# Patient Record
Sex: Male | Born: 1935 | Race: White | Hispanic: No | Smoking: Former smoker
Health system: Southern US, Community
[De-identification: ages and names within clinical notes are randomized; demographics above are authoritative.]

## PROBLEM LIST (undated history)

## (undated) DIAGNOSIS — F329 Major depressive disorder, single episode, unspecified: Secondary | ICD-10-CM

## (undated) DIAGNOSIS — J449 Chronic obstructive pulmonary disease, unspecified: Secondary | ICD-10-CM

## (undated) DIAGNOSIS — I499 Cardiac arrhythmia, unspecified: Secondary | ICD-10-CM

## (undated) DIAGNOSIS — R42 Dizziness and giddiness: Secondary | ICD-10-CM

## (undated) DIAGNOSIS — A389 Scarlet fever, uncomplicated: Secondary | ICD-10-CM

## (undated) DIAGNOSIS — M1990 Unspecified osteoarthritis, unspecified site: Secondary | ICD-10-CM

## (undated) DIAGNOSIS — R011 Cardiac murmur, unspecified: Secondary | ICD-10-CM

## (undated) DIAGNOSIS — IMO0001 Reserved for inherently not codable concepts without codable children: Secondary | ICD-10-CM

## (undated) DIAGNOSIS — I10 Essential (primary) hypertension: Secondary | ICD-10-CM

## (undated) DIAGNOSIS — I509 Heart failure, unspecified: Secondary | ICD-10-CM

## (undated) DIAGNOSIS — I4891 Unspecified atrial fibrillation: Secondary | ICD-10-CM

## (undated) DIAGNOSIS — I2510 Atherosclerotic heart disease of native coronary artery without angina pectoris: Secondary | ICD-10-CM

## (undated) DIAGNOSIS — K219 Gastro-esophageal reflux disease without esophagitis: Secondary | ICD-10-CM

## (undated) DIAGNOSIS — E785 Hyperlipidemia, unspecified: Secondary | ICD-10-CM

## (undated) DIAGNOSIS — R5383 Other fatigue: Secondary | ICD-10-CM

## (undated) DIAGNOSIS — F32A Depression, unspecified: Secondary | ICD-10-CM

## (undated) HISTORY — DX: Major depressive disorder, single episode, unspecified: F32.9

## (undated) HISTORY — DX: Essential (primary) hypertension: I10

## (undated) HISTORY — DX: Heart failure, unspecified: I50.9

## (undated) HISTORY — DX: Scarlet fever, uncomplicated: A38.9

## (undated) HISTORY — DX: Unspecified atrial fibrillation: I48.91

## (undated) HISTORY — DX: Atherosclerotic heart disease of native coronary artery without angina pectoris: I25.10

## (undated) HISTORY — DX: Other fatigue: R53.83

## (undated) HISTORY — DX: Gastro-esophageal reflux disease without esophagitis: K21.9

## (undated) HISTORY — DX: Cardiac murmur, unspecified: R01.1

## (undated) HISTORY — DX: Dizziness and giddiness: R42

## (undated) HISTORY — DX: Reserved for inherently not codable concepts without codable children: IMO0001

## (undated) HISTORY — DX: Hyperlipidemia, unspecified: E78.5

## (undated) HISTORY — DX: Chronic obstructive pulmonary disease, unspecified: J44.9

## (undated) HISTORY — DX: Depression, unspecified: F32.A

## (undated) HISTORY — DX: Cardiac arrhythmia, unspecified: I49.9

## (undated) HISTORY — DX: Unspecified osteoarthritis, unspecified site: M19.90

---

## 2004-12-17 HISTORY — PX: CORONARY ARTERY BYPASS GRAFT: SHX141

## 2004-12-17 HISTORY — PX: AORTIC VALVE REPLACEMENT: SHX41

## 2005-02-23 ENCOUNTER — Encounter: Payer: Self-pay | Admitting: Cardiovascular Disease

## 2005-02-23 ENCOUNTER — Inpatient Hospital Stay (HOSPITAL_COMMUNITY): Admission: EM | Admit: 2005-02-23 | Discharge: 2005-03-09 | Payer: Self-pay | Admitting: Emergency Medicine

## 2005-02-24 ENCOUNTER — Encounter (INDEPENDENT_AMBULATORY_CARE_PROVIDER_SITE_OTHER): Payer: Self-pay | Admitting: *Deleted

## 2009-02-16 ENCOUNTER — Ambulatory Visit (HOSPITAL_COMMUNITY): Admission: RE | Admit: 2009-02-16 | Discharge: 2009-02-16 | Payer: Self-pay | Admitting: Cardiovascular Disease

## 2009-08-19 ENCOUNTER — Ambulatory Visit: Payer: Self-pay | Admitting: Cardiovascular Disease

## 2009-08-19 ENCOUNTER — Telehealth: Payer: Self-pay | Admitting: Cardiovascular Disease

## 2009-08-26 ENCOUNTER — Telehealth: Payer: Self-pay | Admitting: Cardiovascular Disease

## 2009-08-30 ENCOUNTER — Ambulatory Visit: Payer: Self-pay | Admitting: Cardiovascular Disease

## 2009-09-09 ENCOUNTER — Ambulatory Visit: Payer: Self-pay | Admitting: Cardiovascular Disease

## 2009-09-19 ENCOUNTER — Telehealth: Payer: Self-pay | Admitting: Cardiovascular Disease

## 2009-10-03 ENCOUNTER — Ambulatory Visit: Payer: Self-pay | Admitting: Cardiovascular Disease

## 2009-12-02 ENCOUNTER — Ambulatory Visit: Payer: Self-pay | Admitting: Cardiovascular Disease

## 2009-12-02 ENCOUNTER — Telehealth: Payer: Self-pay | Admitting: Cardiovascular Disease

## 2009-12-05 ENCOUNTER — Telehealth: Payer: Self-pay | Admitting: Cardiovascular Disease

## 2010-01-02 ENCOUNTER — Telehealth (INDEPENDENT_AMBULATORY_CARE_PROVIDER_SITE_OTHER): Payer: Self-pay | Admitting: Physician Assistant

## 2010-01-02 ENCOUNTER — Emergency Department (HOSPITAL_COMMUNITY): Admission: EM | Admit: 2010-01-02 | Discharge: 2010-01-02 | Payer: Self-pay | Admitting: Emergency Medicine

## 2010-02-24 ENCOUNTER — Ambulatory Visit: Payer: Self-pay | Admitting: Cardiovascular Disease

## 2010-05-22 ENCOUNTER — Telehealth: Payer: Self-pay | Admitting: Cardiovascular Disease

## 2010-06-23 ENCOUNTER — Ambulatory Visit: Payer: Self-pay | Admitting: Cardiovascular Disease

## 2010-07-26 ENCOUNTER — Telehealth: Payer: Self-pay | Admitting: Cardiovascular Disease

## 2010-08-30 ENCOUNTER — Telehealth: Payer: Self-pay | Admitting: Cardiovascular Disease

## 2010-09-08 ENCOUNTER — Telehealth (INDEPENDENT_AMBULATORY_CARE_PROVIDER_SITE_OTHER): Payer: Self-pay | Admitting: *Deleted

## 2010-09-08 ENCOUNTER — Encounter: Payer: Self-pay | Admitting: Cardiovascular Disease

## 2010-09-08 ENCOUNTER — Ambulatory Visit: Payer: Self-pay | Admitting: Cardiovascular Disease

## 2010-09-11 ENCOUNTER — Ambulatory Visit: Payer: Self-pay

## 2010-09-11 ENCOUNTER — Ambulatory Visit: Payer: Self-pay | Admitting: Internal Medicine

## 2010-09-11 ENCOUNTER — Encounter: Payer: Self-pay | Admitting: Cardiovascular Disease

## 2010-09-11 ENCOUNTER — Ambulatory Visit (HOSPITAL_COMMUNITY): Admission: RE | Admit: 2010-09-11 | Discharge: 2010-09-11 | Payer: Self-pay | Admitting: Cardiovascular Disease

## 2010-09-13 ENCOUNTER — Ambulatory Visit: Payer: Self-pay | Admitting: Cardiovascular Disease

## 2010-09-13 ENCOUNTER — Ambulatory Visit (HOSPITAL_COMMUNITY): Admission: RE | Admit: 2010-09-13 | Discharge: 2010-09-13 | Payer: Self-pay | Admitting: Cardiovascular Disease

## 2010-09-13 HISTORY — PX: CARDIAC CATHETERIZATION: SHX172

## 2010-09-22 ENCOUNTER — Ambulatory Visit: Payer: Self-pay | Admitting: Cardiovascular Disease

## 2010-11-20 ENCOUNTER — Telehealth: Payer: Self-pay | Admitting: Cardiovascular Disease

## 2011-01-07 ENCOUNTER — Encounter: Payer: Self-pay | Admitting: Cardiothoracic Surgery

## 2011-01-18 NOTE — Progress Notes (Signed)
  Phone Note From Other Clinic   Call For: Dr Raynelle Bring Reason for Call: Schedule Patient Appt Summary of Call: Mr Hollibaugh went to [facility] for cp. ER MD felt symptoms m-s in nature but w/ hx CAD, felt f/u appropriate. Pt scheduled to see Dr Freida Busman 1-18 @ 10:30. Initial call taken by: Park Breed PA-C,  January 02, 2010 12:45 PM

## 2011-01-18 NOTE — Progress Notes (Signed)
----   Converted from flag ---- ---- 09/08/2010 2:48 PM, Marilynne Halsted, CMA, AAMA wrote: NO PRECERT FOR THE ECHO.  I AM WORKING ON HRT CATH PRECERT.  ---- 04/54/0981 2:32 PM, Dessie Coma  LPN wrote: Echo at [facility]. for CHF and aortic valve replacement on Mon. 09/11/10 at 1pm/ LT and RT HRT Cath on Wed. 09/13/10 at 8:30am for CHF with Dr. Kirke Corin performing procedure.  Thanks, Victorino Dike ------------------------------

## 2011-01-18 NOTE — Progress Notes (Signed)
  Phone Note Outgoing Call   Call placed by: Dessie Coma  LPN,  July 26, 2010 9:14 AM Call placed to: Patient Summary of Call: Patient notified per Dr. Kirke Corin, increase Livalo to 4mg  at bedtime for elevated cholesterol.  Rx will be called in to [facility].

## 2011-01-18 NOTE — Progress Notes (Signed)
Summary: joint aches  Phone Note Call from Patient   Caller: Daughter Summary of Call: T.C. from Daughter Sheila-patient c/o joint aches again and wants to know if can cut Crestor 10mg  tabs in half to see if that helps.  Will alert Dr. Kirke Corin and call daughter back at [phone]. Initial call taken by: Dessie Coma  LPN,  November 20, 2010 9:27 AM  Follow-up for Phone Call        Phone Call Completed, Kamala Kolton Notified:LMVM for patient to try cutting Crestor in half to see if will help his joint aches.  Follow-up by: Dessie Coma  LPN,  November 20, 2010 3:22 PM

## 2011-01-18 NOTE — Progress Notes (Signed)
  Phone Note Call from Patient   Caller: Daughter Summary of Call: T.C. from Dtr. Sheila-father c/o muscle aches on Lipitor 40mg .  Wants to go back on 20mg .  Tolerated 20mg  OK.  Sheila's ph# [phone].

## 2011-01-18 NOTE — Progress Notes (Signed)
  Phone Note From Pharmacy   Caller: [facility] [place] Reason for Call: Patient requests substitution Action Taken: Nurse/provider will call patient Summary of Call: T.C. from [facility] in [place] re: Rx for Livalo 4mg .  Rx is $85 with insurance.  Patient states will not be able to afford medication and requests something to try that is cheaper.  Will notify MD and call patient.  Initial call taken by: Dessie Coma  LPN,  July 26, 2010 3:34 PM  Follow-up for Phone Call        Patient came by for samples of Livalo 4mg  to last until MD decides what alternative to give him. Follow-up by: Dessie Coma  LPN,  July 27, 2010 8:02 AM

## 2011-01-18 NOTE — Progress Notes (Signed)
  Phone Note Outgoing Call   Call placed by: Dessie Coma  LPN,  August 30, 2010 9:09 AM Call placed to: Patient Summary of Call: Patient notified per Dr. Kirke Corin, lab work was OK that was drawn at [facility].  Has f/u in Jan.

## 2011-02-16 ENCOUNTER — Ambulatory Visit: Payer: Self-pay | Admitting: Cardiovascular Disease

## 2011-02-22 ENCOUNTER — Ambulatory Visit: Payer: Self-pay | Admitting: Cardiovascular Disease

## 2011-03-01 LAB — PROTIME-INR
INR: 1.02 (ref 0.00–1.49)
Prothrombin Time: 13.6 seconds (ref 11.6–15.2)

## 2011-03-01 LAB — POCT I-STAT 3, VENOUS BLOOD GAS (G3P V)
Acid-base deficit: 1 mmol/L (ref 0.0–2.0)
Bicarbonate: 28.1 mEq/L — ABNORMAL HIGH (ref 20.0–24.0)
TCO2: 27 mmol/L (ref 0–100)
TCO2: 30 mmol/L (ref 0–100)
pCO2, Ven: 47.6 mmHg (ref 45.0–50.0)
pCO2, Ven: 47.9 mmHg (ref 45.0–50.0)
pO2, Ven: 33 mmHg (ref 30.0–45.0)

## 2011-03-01 LAB — POCT I-STAT 3, ART BLOOD GAS (G3+)
Acid-Base Excess: 1 mmol/L (ref 0.0–2.0)
O2 Saturation: 96 %
TCO2: 27 mmol/L (ref 0–100)
pCO2 arterial: 39.8 mmHg (ref 35.0–45.0)
pO2, Arterial: 77 mmHg — ABNORMAL LOW (ref 80.0–100.0)

## 2011-03-04 LAB — PROTIME-INR
INR: 1.66 — ABNORMAL HIGH (ref 0.00–1.49)
Prothrombin Time: 19.5 seconds — ABNORMAL HIGH (ref 11.6–15.2)

## 2011-03-04 LAB — POCT I-STAT, CHEM 8
BUN: 16 mg/dL (ref 6–23)
Calcium, Ion: 0.96 mmol/L — ABNORMAL LOW (ref 1.12–1.32)
Chloride: 105 meq/L (ref 96–112)
Creatinine, Ser: 1.3 mg/dL (ref 0.4–1.5)
Glucose, Bld: 95 mg/dL (ref 70–99)
HCT: 44 % (ref 39.0–52.0)
Hemoglobin: 15 g/dL (ref 13.0–17.0)
Potassium: 5.2 meq/L — ABNORMAL HIGH (ref 3.5–5.1)
Sodium: 135 meq/L (ref 135–145)
TCO2: 25 mmol/L (ref 0–100)

## 2011-03-04 LAB — TROPONIN I: Troponin I: 0.06 ng/mL (ref 0.00–0.06)

## 2011-03-04 LAB — CBC
HCT: 41.9 % (ref 39.0–52.0)
MCHC: 33.7 g/dL (ref 30.0–36.0)
RBC: 4.6 MIL/uL (ref 4.22–5.81)
WBC: 12.4 10*3/uL — ABNORMAL HIGH (ref 4.0–10.5)

## 2011-03-04 LAB — DIFFERENTIAL
Basophils Absolute: 0 10*3/uL (ref 0.0–0.1)
Lymphocytes Relative: 16 % (ref 12–46)
Monocytes Absolute: 1.3 10*3/uL — ABNORMAL HIGH (ref 0.1–1.0)
Monocytes Relative: 10 % (ref 3–12)
Neutro Abs: 8.8 10*3/uL — ABNORMAL HIGH (ref 1.7–7.7)

## 2011-03-04 LAB — POCT CARDIAC MARKERS
CKMB, poc: 1.1 ng/mL (ref 1.0–8.0)
Myoglobin, poc: 134 ng/mL (ref 12–200)
Troponin i, poc: <0.05 ng/mL (ref 0.00–0.09)

## 2011-03-04 LAB — APTT: aPTT: 35 seconds (ref 24–37)

## 2011-03-08 ENCOUNTER — Encounter: Payer: Self-pay | Admitting: Cardiovascular Disease

## 2011-03-08 ENCOUNTER — Ambulatory Visit (INDEPENDENT_AMBULATORY_CARE_PROVIDER_SITE_OTHER): Payer: Medicare Other | Admitting: Cardiovascular Disease

## 2011-03-08 ENCOUNTER — Ambulatory Visit: Payer: Self-pay | Admitting: Cardiovascular Disease

## 2011-03-08 DIAGNOSIS — I509 Heart failure, unspecified: Secondary | ICD-10-CM

## 2011-03-08 DIAGNOSIS — I499 Cardiac arrhythmia, unspecified: Secondary | ICD-10-CM

## 2011-03-08 DIAGNOSIS — E785 Hyperlipidemia, unspecified: Secondary | ICD-10-CM

## 2011-03-08 DIAGNOSIS — I4891 Unspecified atrial fibrillation: Secondary | ICD-10-CM | POA: Insufficient documentation

## 2011-03-08 DIAGNOSIS — I2510 Atherosclerotic heart disease of native coronary artery without angina pectoris: Secondary | ICD-10-CM | POA: Insufficient documentation

## 2011-03-08 NOTE — Assessment & Plan Note (Signed)
No clear symptoms of angina at this time. Most recent cardiac cath showed patent grafts. Will continue medical therapy.

## 2011-03-08 NOTE — Assessment & Plan Note (Signed)
His rate is well controlled with Metoprolol.  Continue long term anticoagulation with Warfarin. Goal INR 2-3. This is managed by his PCP.

## 2011-03-08 NOTE — Assessment & Plan Note (Signed)
Unfortunately, he seems to be intolerant to all statins due to myalgia. I think it is reasonable to try Niacin at this time.

## 2011-03-08 NOTE — Progress Notes (Signed)
HPI  [name] [name] is a 76 y/o male with known history of CAD and congestive heart failure as well as chronic atrial fibrillation. He was seen last week by Dr. Maisie Fus for increased dyspnea, cough and weight gain. He was congestive. His BNP was 524 which was double his baseline. His dose of Lasix was increased from 40 mg bid to 40 mg/80 mg. He also used Mucinix. He gradually improved. There was no chest pain. He is back on his usual dose of Lasix. He feels close to baseline.   No Known Allergies   No current outpatient prescriptions on file prior to visit.     Past Medical History  Diagnosis Date  . Hypertension   . COPD (chronic obstructive pulmonary disease)     chronic  . Depression   . Fatigue   . Arthritis   . Hernia NEC     hiatel  . Reflux   . Heart murmur   . Scarlet fever   . Dizziness   . CHF (congestive heart failure)     most recent EF 50-55%  . Coronary artery disease   . Arrhythmia     chronic atrial fibrillation  . Hyperlipidemia      Past Surgical History  Procedure Date  . Coronary artery bypass graft 2006    [place]  . Aortic valve replacement 2006    33-mm pericardial Edward's valve  . Cardiac catheterization 09/13/2010    patent grafts. no significant pulmonary hypertension.      Family History  Problem Relation Age of Onset  . Cancer Mother   . Heart attack Father   . Heart attack Brother   . Cancer Brother      History   Social History  . Marital Status: Single    Spouse Name: N/A    Number of Children: N/A  . Years of Education: N/A   Occupational History  . Not on file.   Social History Main Topics  . Smoking status: Former Smoker    Quit date: 12/17/1980  . Smokeless tobacco: Not on file  . Alcohol Use: No  . Drug Use: No  . Sexually Active:    Other Topics Concern  . Not on file   Social History Narrative  . No narrative on file     ROS Constitutional: Negative for fever, chills, diaphoresis, activity change,  appetite change and fatigue.  HENT: Negative for hearing loss, nosebleeds, congestion, sore throat, facial swelling, drooling, trouble swallowing, neck pain, voice change, sinus pressure and tinnitus.  Eyes: Negative for photophobia, pain, discharge and visual disturbance.  Respiratory: Negative for apnea, cough, chest tightness, shortness of breath and wheezing.  Cardiovascular: Negative for chest pain, palpitations and leg swelling.  Gastrointestinal: Negative for nausea, vomiting, abdominal pain, diarrhea, constipation, blood in stool and abdominal distention.  Genitourinary: Negative for dysuria, urgency, frequency, hematuria and decreased urine volume.  Musculoskeletal: Negative for myalgias, back pain, joint swelling, arthralgias and gait problem.  Skin: Negative for color change, pallor, rash and wound.  Neurological: Negative for dizziness, tremors, seizures, syncope, speech difficulty, weakness, light-headedness, numbness and headaches.  Psychiatric/Behavioral: Negative for suicidal ideas, hallucinations, behavioral problems and agitation. The patient is not nervous/anxious.    PHYSICAL EXAM   BP 111/68  Pulse 51  Wt 221 lb 12.8 oz (100.608 kg)  SpO2 97%  Constitutional: He is oriented to person, place, and time. He appears well-developed and well-nourished. No distress.  HENT:  Head: Normocephalic and atraumatic.  Eyes: Pupils are  equal, round, and reactive to light. Right eye exhibits no discharge. Left eye exhibits no discharge.  Neck: Normal range of motion. Neck supple. No JVD present. No thyromegaly present.  Cardiovascular:  irregular rhythm, normal heart sounds and intact distal pulses. Exam reveals no gallop and no friction rub.  No murmur heard.  Pulmonary/Chest: Effort normal and breath sounds normal. No stridor. No respiratory distress. He has no wheezes. He has no rales. He exhibits no tenderness.  Abdominal: Soft. Bowel sounds are normal. He exhibits no distension.  There is no tenderness. There is no rebound and no guarding.  Musculoskeletal: Normal range of motion. He exhibits no edema and no tenderness.  Neurological: He is alert and oriented to person, place, and time. Coordination normal.  Skin: Skin is warm and dry. No rash noted. He is not diaphoretic. No erythema. No pallor.  Psychiatric: He has a normal mood and affect. His behavior is normal. Judgment and thought content normal.      ASSESSMENT AND PLAN

## 2011-03-08 NOTE — Assessment & Plan Note (Signed)
Acute on chronic. Seems to be improving after he was treated by Dr. Maisie Fus. He is back on regular dose Lasix and feels close to his baseline. He has borderline ejection fraction. He did have cardiac catheterization in 09/11 and no intervention was needed. Will continue medical therapy. Avoid over diuresis due to mild renal failure. Current dose of Lasix 40 mg bid seems reasonable.

## 2011-03-30 ENCOUNTER — Ambulatory Visit: Payer: Self-pay | Admitting: Cardiovascular Disease

## 2011-05-01 NOTE — Assessment & Plan Note (Signed)
[name] [name]                        [place] OFFICE NOTE   NAME:[name] [name], [name] [name]                     MRN:          [id number]  DATE:06/23/2010                            DOB:          26-Aug-1935    PROBLEM LIST:  1. Coronary artery disease status post coronary artery bypass graft in      2006 at [name] with left internal mammary artery to      left anterior descending, saphenous vein graft to right coronary      artery, saphenous vein graft to obtuse marginal 1 and obtuse      marginal 3.  2. Status post aortic valve replacement with a 33-mm pericardial      Edwards valve, model number 2700, serial number [id number].  3. Congestive heart failure with ischemic cardiomyopathy and mildly      reduced left ventricular systolic function.  Ejection fraction is      40%.  4. Hyperlipidemia.  5. Hypertension.  6. Chronic atrial fibrillation, on long-term anticoagulation.  7. Chronic obstructive pulmonary disease.   INTERVAL HISTORY:  [name] [name] is here for followup visit.  He has been  reasonably stable since the last time he was seen.  He has chronic  dyspnea with his regular activities, but is still able to work around  his yard.  He gets out of breath after about 10-15 minutes with his  regular activities.  He does not really get significant episodes of  chest pain.  He has major issues at this time that include myalgias,  back pain and lower extremity pain which he attributes to Lipitor.  He  has been very hesitant on taking statins, he has been taking 20 mg of  Lipitor though.  He denies any orthopnea or PND.  No lower extremity  edema.  He has been taking rest of his medications.   SOCIAL HISTORY:  He quit smoking 29 years ago.   CURRENT MEDICATIONS:  1. Warfarin as directed.  2. Metoprolol 50 mg twice daily.  3. Aspirin 81 mg daily.  4. Fish oil 40981 mg twice daily.  5. Klor-Con 10 mEq once daily.  6. Furosemide 40 mg  once daily.  7. Lipitor 20 mg at bedtime.  8. Losartan 50 mg once daily.   PHYSICAL EXAMINATION:  GENERAL:  He appears to be in no acute distress.  VITAL SIGNS:  Weight is 229.8 pounds, blood pressure 141/71, pulse is  54, oxygen saturation is 99% on room air.  NECK:  No JVD, carotid bruits or scars.  LUNGS:  Few crackles at the left lung base.  CARDIOVASCULAR:  Irregularly irregular with no appreciated gallops or  murmurs.  ABDOMEN:  Benign, nontender, nondistended.  EXTREMITIES:  No clubbing, cyanosis or edema.  Femoral pulses are +2  bilaterally.  Posterior tibial pulses +2 bilaterally as well.   LABORATORY AND DIAGNOSTIC DATA:  His labs were as reviewed.  His BNP was  281 which was slightly higher than before.  His lipid profile shows now  an LDL of 79, triglyceride of 61 and an HDL of 43.  This is  significantly improved since his last lipid profile.  His creatinine was  1.4.   IMPRESSION:  1. Congestive heart failure with mildly reduced left ventricular      systolic function due to ischemic cardiomyopathy.  He continues to      be in class II and sometimes class III heart failure.  His main      symptoms are dyspnea  Although his BNP was slightly elevated, he      seems to be well compensated at this time.  There is no significant      fluid overload.  We will continue with current dose of furosemide.      We will continue with his beta-blocker and ARB.  2. Coronary artery disease:  No evidence of angina at this time.  We      will continue with his current medications.  3. Hyperlipidemia:  Again, he is complaining of myalgia with Lipitor.      He has been taking 20 mg daily.  He informed me that he might stop      taking the medication altogether.  I explained to him the      importance of statins and prevention of progression of coronary      artery disease.  I will switch him to Livalo 2 mg once daily to      replace Lipitor.  We will have followup lipid and liver  profile as      well as CPK in 4 weeks from now.  He is also concerned about the      cost, but we will try to help him with this.  4. Chronic atrial fibrillation.  Continue long-term anticoagulation      with warfarin.  The patient will follow up in 6 months from now or      earlier if needed.     Lorine Bears, MD  Electronically Signed    MA/MedQ  DD: 06/23/2010  DT: 06/24/2010  Job #: [id number]

## 2011-05-01 NOTE — Assessment & Plan Note (Signed)
[facility]                        [place] CARDIOLOGY OFFICE NOTE   NAME:[name] [name], [name] [name]                     MRN:          [id number]  DATE:09/09/2009                            DOB:          07-20-1935    PROBLEM LIST:  1. Coronary artery disease, status post coronary artery bypass graft      in 2006 with a left internal mammary artery to left anterior      descending, vein graft to right coronary artery, sequential vein      graft to OM1 and OM3.  He is also status post aortic valve      replacement with a 23-mm pericardial Edwards valve, model number      2700, serial number [id number].  2. Ischemic cardiomyopathy with an ejection fraction of 40% on an echo      dated August 26, 2009.  There is only trace MR and mild TR at      that time.  3. Hyperlipidemia.  Fasting lipid profile from August 19, 2009,      shows a total cholesterol of 242, HDL 45, triglycerides 115, LDL      174, that was on no medical treatment for hyperlipidemia.  4. Hypertension.  5. Atrial fibrillation, on Coumadin therapy.  6. Chronic obstructive pulmonary disease.   INTERVAL HISTORY:  The patient states since his last visit at the  beginning of September, he has actually felt better.  His episodes of  dyspnea on exertion have significantly improved.  He states that he  continues to work at [employer] which involves a good deal of walking.  He is able to perform his daily activities and only very occasionally  gets short of breath to the point where he must rest.  He denies any  episodes of chest discomfort.  He sleeps on 1 pillow.  Denies any PND or  lower extremity edema.  He states compliance with all of his medications  and he has been tolerating his new simvastatin well.  He denies any  dizziness or syncope.   REVIEW OF SYSTEMS:  Otherwise, negative.   MEDICATION LIST:  1. Aspirin 81 mg daily.  2. Coumadin 5 mg 5 days a week and 2.5 mg 2 days a week.  3.  Metoprolol tartrate 50 mg b.i.d.  4. Lasix 40 mg daily.  5. Fish oil 1200 mg b.i.d.  6. Klor-Con 1 daily.  7. Simvastatin 40 mg nightly.   SOCIAL HISTORY:  History of tobacco and alcohol, but has not used either  in 48 years.   FAMILY HISTORY:  Positive for coronary artery disease and multiple  malignancies.   PHYSICAL EXAMINATION:  VITAL SIGNS:  He has a blood pressure of 127/70,  heart rate is 68, weight is 223 pounds, which is 2-3 pounds more than he  weighed a month ago.  He is satting 97% on room air.  GENERAL:  No acute distress.  HEENT:  Nonfocal.  NECK:  Supple.  There is no JVD.  There are no carotid bruits.  HEART:  Irregularly irregular.  Without murmur, rub, or gallop.  LUNGS:  Clear  bilaterally.  ABDOMEN:  Soft, nontender, nondistended.  EXTREMITIES:  Without edema.  SKIN:  Warm and dry.   Review of the patient's echocardiogram as above in his problem list.  The patient also wore a Holter monitor that showed an average heart rate  of 75, maximum of 96 and minimum of 57, there were occasional  bradycardic episodes in the early morning.  There are also infrequent  premature ventricular contractions.   ASSESSMENT AND PLAN:  The patient appears to be doing well from a  cardiovascular standpoint.  His dyspnea on exertion has improved and he  is tolerating simvastatin well.  He has an ischemic cardiomyopathy with  NYHA class 2 symptoms.  We recommend that he continue his medications as  listed above.  In addition, we would like to start an ACE inhibitor or  an ARB today.  Follow up with a BMP in 1-week time.  We will see the  patient back in clinic in 2-3 months at which time we will recheck his  fasting lipids and an INR.  Regarding the patient's occasional  bradycardic episodes in the evening, he is told that if he was to  experience any dizziness whatsoever or increase in his shortness of  breath or fatigue, we will consider decreasing his metoprolol dose to 25  mg  p.o. b.i.d.  He will contact our office in case this happens before  his next visit.     Brayton El, MD  Electronically Signed    SGA/MedQ  DD: 09/09/2009  DT: 09/09/2009  Job #: [id number]

## 2011-05-01 NOTE — Assessment & Plan Note (Signed)
[facility] CARDIOLOGY OFFICE NOTE   NAME:[name] [name], [name] [name]                     MRN:          [id number]  DATE:02/24/2010                            DOB:          1935/07/26    PROBLEM LIST:  1. Coronary artery disease status post coronary artery bypass graft in      2006 with a left internal mammary artery to left anterior      descending , saphenous vein graft to right carotid artery,      saphenous vein graft to obtuse marginal 1 and obtuse marginal 3.  2. Status post aortic valve replacement with a 33-mm pericardial      Edwards valve, model number 2700, serial number [id number].  3. Ischemic cardiomyopathy with an ejection fraction 40% on echo from      August 26, 2009.  4. Hyperlipidemia.  5. Hypertension.  6. Chronic atrial fibrillation, on Coumadin with last Holter in      September 2010.  7. Chronic obstructive pulmonary disease.   INTERVAL HISTORY:  The patient has had his last office visit he had bout  of pneumonia, was on antibiotics at home.  Of note, he states he was  afebrile during this episode.  Since then he has been feeling better.  However, he continues to have dyspnea on exertion.  He states that it is  intermittent and sometimes occurs with little exertion and sometimes  requires a significant exertion to recur.  He denies any lower extremity  edema, chest discomfort, PND, or orthopnea.  He has been taken off on  ramipril and he is no longer coughing, although it is unclear whether  the cough was from a respiratory illness or from side effect of the ACE  inhibitor.  He has been compliant with his Lipitor and has not had any  muscle aches as he did with simvastatin in the past.   REVIEW OF SYSTEMS:  Positive for frequent waking at night and his family  states that he snores quite a bit.   PHYSICAL EXAMINATION:  VITAL SIGNS:  Today, his blood pressure is  121/72, his pulse 62, his weight is  223, which is 8 pounds more than he  weighed in December.  He is sating 97% on room air.  The patient was  walked in the office and his saturations as stated 97%.  GENERAL:  He is in no acute distress.  HEENT:  Normocephalic and atraumatic.  NECK:  Supple.  There is very minimal JVD.  HEART:  Irregularly irregular without murmur, rub, or gallop.  LUNGS:  Some mild crackles at the right base.  ABDOMEN:  Soft and nontender.  EXTREMITIES:  Without edema.  SKIN:  Warm and dry, pulse 2+ bilateral radial and posterior tibial  pulses.  PSYCHIATRIC:  The patient is appropriate.   Review of the patient's lipid profile dated December 2010, his total  cholesterol 229, LDL 161, HDL 43, and triglycerides 409.   ASSESSMENT AND PLAN:  1. Coronary artery disease.  The patient is not having any symptoms      consistent with angina.  He should continue on aspirin, beta-      blocker, statin, fish oil, and we will add ARB today.  2. Hyperlipidemia.  The patient's last LDL was 160.  He is currently      taking Lipitor 20 mg daily and tolerating this.  We will increase      to 40 mg today and samples are given.  3. Ischemic cardiomyopathy, ejection fraction was 40% on his last      echocardiogram and he is on metoprolol tartrate 50 mg twice a day      and we will start losartan 50 mg daily today as he has been      possibly intolerant to ACE INHIBITORS secondary to cough in the      past.  He is perhaps slightly volume overloaded today and we will      ask him to increase his Lasix to 40 mg twice a day for the next 4-5      days.  Once the patient may goes back to his 40 mg day dose, if his      shortness of breath was significantly better on the higher dose,      he will contact the office and it may be that he needs a higher      baseline dose of the Lasix.  4. Sleep disturbances.  The patient possibly has obstructive sleep      apnea.  We have offered him a sleep test today that he currently       declines.  5. Atrial fibrillation.  The patient should continue on Coumadin.  Her      appears to be well rate controlled and was so per his left heart      monitor.  6. Valve replacement, last echo showed the valve to be functioning      adequately.  7. Chronic obstructive pulmonary disease is managed by his primary      care physician.  We will see the patient back in 3 months' time      with a CMP, fasting lipid, and BNP.     Brayton El, MD  Electronically Signed    SGA/MedQ  DD: 02/24/2010  DT: 02/25/2010  Job #: [id number]   cc:   M. Katharina Caper, MD

## 2011-05-01 NOTE — Assessment & Plan Note (Signed)
[facility]                        [place] CARDIOLOGY OFFICE NOTE   NAME:[name] [name], [name] [name]                     MRN:          [id number]  DATE:12/02/2009                            DOB:          [birth year]    PROBLEM LIST:  1. Coronary artery disease status post coronary artery bypass graft in      2006 with a left internal mammary artery to the left anterior      descending, saphenous vein graft to the right coronary artery,      saphenous vein graft to an obtuse marginal 1 and obtuse marginal 3.  2. Status post aortic valve replacement with a 33-mm pericardial      Edwards valve model number 2700, serial number [id number].  3. Ischemic cardiomyopathy with an ejection fraction of 40% on echo      from August 26, 2009.  4. Hyperlipidemia.  5. Hypertension.  6. Chronic atrial fibrillation, on Coumadin with his last Holter in      September 2010.  7. Chronic obstructive pulmonary disease.   INTERVAL HISTORY:  The patient states since last office visit, he had  seen his primary care physician who thought he had a combination of  bronchitis and/or a CHF exacerbation.  She increased his Lasix for a  week and placed him on antibiotics.  The patient states he feels better  since that time.  The patient was also taken off his simvastatin, as he  had bilateral lower extremity muscle aches.  His muscle aches have  resolved after stopping his simvastatin.  The patient also endorses a  cough today that he thinks probably started since he began taking the  ramipril.  He states the cough has significantly improved now.  However,  it is still present.  The patient does endorse occasional episode of  chest discomfort, but this has not changed in quite sometime and the  episodes are self-limiting.  The patient does state he has had increased  shortness of breath and dyspnea on exertion over the past month or two  that is slightly above his baseline level and he has  been attributing  this to the possible bronchitis.   PHYSICAL EXAMINATION:  VITAL SIGNS:  Today, blood pressure in the right  arm is 82/52, left arm is 87/55; pulse is 65; sating 97% on room air.  He weighs 216 pounds in general, which is 7 pounds less than he weighed  in October.  GENERAL:  No acute distress.  HEENT:  Normocephalic, atraumatic.  NECK:  Supple.  There are no carotid bruits.  HEART:  Irregularly irregular without murmur, rub or gallop.  LUNGS:  Clear bilaterally.  ABDOMEN:  Soft, nontender, and nondistented.  EXTREMITIES:  Without edema.  SKIN:  Cool and dry.  MUSCULOSKELETAL:  5/5 in bilateral upper and lower extremity strength.  PSYCHIATRIC:  The patient is appropriate.   Review of the patient's cholesterol profile from September showed an LDL  of 174 and that was without any medical treatment.   ASSESSMENT AND PLAN:  1. Coronary artery disease.  The patient is not having any progression  of the infrequent angina that he experiences.  He should continue      on aspirin, beta-blocker, and fish oil.  We will try a different      statin today as he was intolerant to SIMVASTATIN in the past.  2. Hyperlipidemia.  We will give the patient samples of Lipitor 20 mg      daily.  I instructed to stop this as well if muscle aches reoccur.  3. Ischemic cardiomyopathy.  The patient has an increased dyspnea on      exertion recently, however, it is not clear whether this is      secondary to a pulmonary etiology or heart failure.  He is on      metoprolol tartrate 50 mg b.i.d. and ramipril 2.5 mg daily.  Today,      we will ask him to stop the ramipril 2.5 mg daily as this may be      the source of his cough.  If his cough completely resolves, he may      try low dose ARB at his next office visit.  The patient is also      slightly hypotensive compared to his normal blood pressures today.      He states that when he has checked in [employer], it was always in      the 100s.   This may be spurious reading today or it may be      secondary to slight over diuresis as he has been taking increased      Lasix over the past week.  The patient has labs pending for today      which we will review and we will see him back in 3 weeks' time to      see how he is doing off the ramipril.  4. Atrial fibrillation.  The patient is to continue on Coumadin and he      was well rate controlled per his last Holter monitor.  5. Chronic obstructive pulmonary disease.  The patient is now on      inhalers prescribed by his primary care physician.      Brayton El, MD  Electronically Signed    SGA/MedQ  DD: 12/02/2009  DT: 12/02/2009  Job #: [id number]

## 2011-05-01 NOTE — H&P (Signed)
NAMENICKALOUS, STINGLEY NO.:  [id number]   MEDICAL RECORD NO.:  [id number]          PATIENT TYPE:  OIB   LOCATION:                               FACILITY:  [facility]   PHYSICIAN:  Vesta Mixer, M.D. DATE OF BIRTH:  1935/01/30   DATE OF ADMISSION:  02/16/2009  DATE OF DISCHARGE:                              HISTORY & PHYSICAL   [name] [name] is a elderly gentleman with a history of CAD and CABG.  He is status post CABG as well as aortic valve replacement using a  pericardial tissue valve.  He has a history of hyperlipidemia and  hypertension.  He has had atrial fibrillation and is now admitted for  cardioversion.   [name] [name] has a history of intermittent atrial fibrillation from his  bypass surgery years ago.  He has done fairly well and has been in sinus  rhythm until recently.  He presented with increased shortness of breath.  We started him on Coumadin and I have added Lopressor.  He feels quite a  bit better, but remains in atrial fibrillation.  He denies any syncope  or presyncope.  He denies any PND or orthopnea.  He does have some  shortness breath.  He denies any chest pain.   CURRENT MEDICATIONS:  1. Amlodipine 5 mg a day.  2. Aspirin 81 mg a day.  3. Furosemide 4 mg a day.  4. Potassium chloride 10 mEq a day.  5. Fish oil twice a day.  6. Metoprolol 50 mg twice a day.  7. Coumadin 5 mg 5 days a week with 2.5 mg 2 days a week.   ALLERGIES:  No known drug allergies.   PAST MEDICAL HISTORY:  1. History of coronary artery disease - status post coronary artery      bypass grafting in March 2006.  He had an LIMA to LAD, SVG to RCA,      sequential SVG to OM1 and OM3.  2. Status post pericardial aortic tissue valve replacement.  3. History of hyperlipidemia.  4. Hypertension.  5. Atrial fibrillation.   SOCIAL HISTORY:  The patient used to smoke, but quit in 1983.   FAMILY HISTORY:  Positive for cardiac disease.   REVIEW OF SYSTEMS:  Reviewed  and as noted in the HPI.  All other systems  are reviewed and are negative.   PHYSICAL EXAMINATION:  GENERAL:  He is an elderly gentleman, in no acute  distress.  He is alert and oriented x3 and his mood and affect are  normal.  VITAL SIGNS:  His weight is 219, which is up 4 pounds.  Blood pressure  is 130/70, with heart rate of 68.  HEENT:  Carotids 2+.  He has no bruits or no JVD.  NECK:  Supple.  There is no thyromegaly.  LUNGS:  Clear.  BACK:  Nontender.  HEART:  Irregularly irregular.  He has a soft systolic murmur.  ABDOMINAL:  Good bowel sounds and is nontender.  He has no  hepatosplenomegaly and his abdomen is nontender.  There is no guarding  or rebound.  SKIN:  Warm and dry without rashes.  EXTREMITIES:  He has no clubbing, cyanosis, or edema.  GAIT:  Normal.  NEUROLOGIC:  Nonfocal.   LABORATORY DATA:  His INR is 2.0.  Remaining laboratory data are within  normal limits.   [name] [name] presents with atrial fibrillation.  He has been therapeutic  on his Coumadin for the past month or so.  We will schedule him for  elective cardioversion.  We have discussed the risks, benefits, and  options of cardioversion.  He understands and agrees to proceed.      Vesta Mixer, M.D.  Electronically Signed     PJN/MEDQ  D:  02/11/2009  T:  02/12/2009  Job:  [id number]   cc:   Brett Canales A. Cleta Alberts, M.D.

## 2011-05-01 NOTE — Assessment & Plan Note (Signed)
[facility]                        [place] CARDIOLOGY OFFICE NOTE   NAME:[name] [name] SCHEURICH                     MRN:          [id number]  DATE:09/22/2010                            DOB:          Aug 16, 1935    [name] [name] is a 76 year old gentleman who is here today for a followup  visit after recent cardiac catheterization.  He has the following  problem list:  1. Coronary artery disease status post coronary artery bypass graft      surgery in 2006 at [facility].  Most recent cardiac      catheterization was on September 13, 2010.  Catheterization showed      significant three-vessel coronary artery disease.  His grafts were      patent.  left internal mammary artery to left anterior descending      coronary artery was patent, saphenous vein graft to right coronary      artery was patent with 30-40% diffuse distal disease.  There was a      sequential saphenous vein graft to obtuse marginal 2 and obtuse      marginal 3.  The part going to obtuse marginal 3 had an 80% tubular      stenosis.  However, the obtuse marginal distribution was small and      overall was filling from the native flow from the obtuse marginal 2      territory.  Thus I felt that angioplasty would not achieve much      additional benefit in that situation.  Right heart catheterization      showed no significant pulmonary hypertension with only minimal      elevation of pulmonary capillary wedge pressure.  Cardiac output      was moderately reduced.  2. Status post aortic valve replacement with a 33-mm pericardial      Edward's valve in 2006.  Most recent echo was in September of 2011,      showed mildly elevated gradient which is expected.  Ejection      fraction was 50-55%.  3. Congestive heart failure with most recent ejection fraction of 50-      55%.  4. Chronic atrial fibrillation on long-term anticoagulation.  5. Hypertension.  6. Hyperlipidemia.  7. Chronic  obstructive pulmonary disease.   INTERIM HISTORY:  The patient overall is still doing the same.  He is  not having chest pain.  His main complaints still are exertional dyspnea  and generalized fatigue.  He is taking furosemide once daily.  He  resumed his work.   MEDICATIONS:  1. Warfarin as directed.  2. Metoprolol tartrate 50 mg twice daily.  3. Aspirin 81 mg once daily.  4. Fish oil 1200 mg twice daily.  5. Klor-Con 10 mEq twice daily.  6. Losartan 50 mg once daily.  7. Crestor 10 mg once daily.  8. Daliresp 500 mcg once daily.  9. Furosemide 40 mg once daily.   PHYSICAL EXAMINATION:  VITAL SIGNS:  Weight is 224.8 pounds, blood  pressure is 114/58, pulse is 61, oxygen saturation is 99% on room air.  NECK:  Reveals no JVD or carotid bruits.  LUNGS:  Clear to auscultation.  HEART:  Regular rate and rhythm with no gallops or murmurs.  ABDOMEN:  Benign, nontender, and nondistended.  EXTREMITIES:  With no clubbing, cyanosis, or edema.  Right groin area is  intact with no bruising or hematoma.   IMPRESSION:  1. Coronary artery disease:  Recent cardiac catheterization showed      patent grafts.  No intervention was needed.  We will continue with      medical therapy as is being done.  2. Congestive heart failure:  I believe this is multifactorial due to      diastolic dysfunction and atrial fibrillation which is chronic.      His right heart catheterization showed only minimal elevation of      pulmonary capillary wedge pressure, but cardiac output was      moderately reduced.  There was no significant pulmonary      hypertension.  He seems to be stable on current dose of furosemide      40 mg once daily.  This can be switched to twice daily only as      needed if there are more signs of fluid overload.  At this time, he      seems to be euvolemic.  We will continue with losartan 50 mg daily      and metoprolol 50 mg twice daily.  3. Chronic atrial fibrillation:  His rate is  well-controlled.  We will      continue long-term anticoagulation with warfarin.  The patient will      follow up in 4 months from now or earlier if needed.     Lorine Bears, MD  Electronically Signed    MA/MedQ  DD: 09/22/2010  DT: 09/22/2010  Job #: [id number]

## 2011-05-01 NOTE — Assessment & Plan Note (Signed)
[facility]                        [place] CARDIOLOGY OFFICE NOTE   NAME:[name] [name], [name] [name]                     MRN:          [id number]  DATE:10/03/2009                            DOB:          19-Sep-1935    PROBLEM LIST:  1. Coronary artery disease status post coronary artery bypass graft in      2006 with a left internal mammary artery graft to left anterior      descending coronary artery, saphenous vein graft to right coronary      artery, and saphenous vein graft to first obtuse marginal artery      and third obtuse marginal artery.  He is also status post aortic      valve replacement of 23-mm pericardial Edwards valve model number      #2700, serial number [id number].  2. Ischemic cardiomyopathy with an ejection fraction of 40% on echo      from August 26, 2009.  3. Hyperlipidemia.  Fasting lipid profile from August 19, 2009 shows      total cholesterol 242, HDL 45, triglycerides 115, and LDL 174.  He      was on no medical treatment for hyperlipidemia at that point.  4. Hypertension.  5. Atrial fibrillation on Coumadin therapy.  6. Chronic obstructive pulmonary disease.   INTERVAL HISTORY:  The patient comes in today complaining of right lower  extremity pain.  He states that the lateral aspect of his right leg has  intermittently hurt him for the past 2 weeks.  He does state  approximately 2-3 weeks ago that he scraped the skin on that leg through  a minor traumatic accident.  The patient noticed lower extremity edema,  although he states that his pain is more pronounced on the right leg.  He states this pain usually occurs upon standing or walking, but is not  always present.   PHYSICAL EXAMINATION:  VITAL SIGNS:  His temperature 97.2, blood  pressure is 105/60, pulse is 60, his weight is 223 pounds, and he is  sating 94% on room air.  GENERAL:  He is in no acute distress.  HEENT:  Nonfocal.  HEART:  Irregularly irregular.  LUNGS:   Clear.  EXTREMITIES:  There is no lower extremity edema.  He has right lower  extremity edema, there is a well-healing abrasion along the lateral  aspect of his right leg.  There is no tenderness to palpation.  There  are palpable dorsalis pedis pulses bilaterally.  There are no masses  palpated over his right leg.   ASSESSMENT AND PLAN:  His right leg discomfort is likely musculoskeletal  in origin.  It is likely that the minor trauma the patient experienced  to his right leg several weeks ago is a source of this.  We cannot rule  out a stress fracture at this time.  We will order a right lower  extremity x-ray and/or to rule out any orthopedic injuries.  It should  be noted he specifically denies any knee or ankle pain making radiation  of arthritis pain less likely.  The patient is also  on Coumadin.  His last INR was therapeutic at 2.9 making deep venous  thrombosis very  unlikely.  We will contact the patient once results of the x-ray are  obtained.  In the interim, the patient should use Tylenol p.r.n. for  pain control.     Brayton El, MD  Electronically Signed    SGA/MedQ  DD: 10/03/2009  DT: 10/04/2009  Job #: [id number]

## 2011-05-01 NOTE — Assessment & Plan Note (Signed)
[facility]                        [place] CARDIOLOGY OFFICE NOTE   NAME:Fiallo, TIMMEY LAMBA                     MRN:          [id number]  DATE:08/19/2009                            DOB:          [birth year]    CHIEF COMPLAINT:  Shortness of breath.   HISTORY OF PRESENT ILLNESS:  Mr. [name] [name] is a 76 year old white male,  past medical history significant for coronary artery disease status post  CABG in 2006, he is status post aortic valve replacement with a tissue  valve at that time, hypertension, hyperlipidemia, atrial fibrillation on  Coumadin, COPD, who is presenting with a several month history of  increased dyspnea on exertion.  The patient states that for the past  several months when he exerts himself to any degree he gets shortness of  breath more then he had previously.  He describes no shortness of breath  at rest.  He also denies any PND, orthopnea, or lower extremity edema.  The patient denies any chest discomfort associated with this shortness  of breath.  He states he has only experienced 2 episodes of chest  discomfort.  Several nights ago that was a sharp pain in his left side  lasting only seconds without any associated symptoms.  This sharp pain  lasted reoccurred the next day.  He has not had any return of this sharp  discomfort nor has had any other type of chest discomfort.  Of note when  the patient presented with an acute myocardial infraction in 2006, he  had a classic substernal chest discomfort.  The patient states that  other then the above symptoms, he occasional feels palpitations.  He  denies any frank wheezing.  He was diagnosed with COPD and evidently had  pulmonary function test.  However, he states that the inhalers he has  tried have not lead to any improvement in his symptoms; therefore, he  stopped using them.  The patient also states that he prefers to be on as  few medications as possible and does not like to see  physicians  regularly.   PAST MEDICAL HISTORY:  Coronary artery disease status post CABG in March  2006 with the LIMA to the LAD, vein graft to the RCA, and sequential  vein graft to OM1 and OM3.  He is status post aortic valve replacement  with a 23 mm pericardial Edwards valve model #2700, serial [id number].  This was done by Dr. Kathlee Nations Trigt.  Rest of past medical history is  as above and HPI.   SOCIAL HISTORY:  History of tobacco and alcohol, but has not used either  in 28 years.  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   FAMILY HISTORY:  Positive for coronary artery disease and multiple  malignancies.   ALLERGIES:  No known drug allergies.   MEDICATIONS:  1. Aspirin 81 mg daily.  2. Coumadin 5 mg 5 days a week and 2.5 mg 2 days a week.  3. Metoprolol tartrate 50 mg b.i.d.  4. Lasix 40 mg daily.  5. Fish oil 1200 mg b.i.d.  6. Klor-Con 1 daily.   REVIEW OF SYSTEMS:  Positive for difficulty with sleeping, intermittent  fatigue, other systems are as in HPI, otherwise negative.   PHYSICAL EXAMINATION:  VITAL SIGNS:  The patient has a blood pressure of  144/78, pulse of 73, sating 98% on room air and he weighs 220 pounds.  GENERAL:  He is no acute distress.  HEENT:  Nonfocal.  NECK:  Supple.  There is no JVD.  There are no carotid bruits.  HEART:  Irregularly irregular.  There is no murmur, rub, or gallop.  There is a increased  second heart sounds.  LUNGS:  Clear to auscultation bilaterally.  ABDOMEN:  Soft, nontender, nondistended.  There is a small umbilical  hernia present.  EXTREMITIES:  Without edema.  MUSCULOSKELETAL:  The patient is 5/5, bilateral upper and lower  extremity strength.  SKIN:  Warm and dry.  NEUROLOGIC:  Nonfocal.  PSYCHIATRIC:  The patient is appropriate with normal levels of insight.  Pulses; the patient has 2+ carotid, radial, and dorsalis pedis pulses.   EKG independently reviewed by myself demonstrates atrial  fibrillation  with a ventricular response rate of 81 beats per minute.  The patient's  medical record was reviewed demonstrates a cough with ACE inhibitor  therapy.  An Echo from January of this year shows an ejection fraction  of 45% to 50%, a peak velocity across the aortic valve 1.9 m/sec.  Normal LV size and mild pulmonary hypertension.   ASSESSMENT:  A 76 year old white male with known coronary artery disease  and chronic obstructive pulmonary disease who is presenting with  increased dyspnea on exertion.  The patient does not have any clear  signs of angina and that the chest discomfort he described was very  atypical.  He appears euvolemic on exam today.  The likely differential  for his dyspnea on exertion includes atrial fibrillation that may not be  well rate controlled with activity, diastolic dysfunction, or perhaps  worsening of his left ventricular systolic function, chronic obstructive  pulmonary disease.   PLAN:  Today in clinic, we will check a series of labs including a CMP,  CBC, INR, fasting lipids, and BNP.  We will order a transthoracic  echocardiogram to evaluate the patient's valve structure and function as  well as to reevaluate the patient's left ventricular systolic function  as he has had a change in symptoms since  his last echo earlier in the  year.  We have placed the patient on a 48-hour Holter monitor to assess  his rate control as he appears to be in chronic atrial fibrillation.  We  discussed the possibility of performing a stress test with the patient;  however, he feels strongly about not wanting that diagnostic test at  this time.  Of note, the patient is mildly hypertensive today in clinic,  but he states that his blood pressures when he checks at [facility] are  always significantly lower.  Therefore at this time, we will not make  any changes to his medical therapy.  In the future it is quite likely  that would benefit from statin and possibly  ARB  therapy.  We will see the patient back in 2 weeks' time once the results  of the tests are  obtained.  In the interim, he is reminded that if he were to experience  persistent new onset chest discomfort that he should call 911.     Brayton El, MD  Electronically Signed  SGA/MedQ  DD: 08/19/2009  DT: 08/19/2009  Job #: [id number]

## 2011-05-01 NOTE — Assessment & Plan Note (Signed)
[facility]                        [facility] OFFICE NOTE   NAME:Vandeventer, AMARIYON MAYNES                     MRN:          [id number]  DATE:09/08/2010                            DOB:          1935/09/24    Mr. Colello is a 76 year old gentleman who is here for an urgent  followup visit due to change in his condition.  He has the following  problems list:  1. Coronary artery disease, status post coronary artery bypass graft      surgery in 2006 at [facility] with LIMA to LAD, SVG to      RCA, SVG to OM1 and OM3.  2. Status post aortic valve replacement with a 33-mm pericardial      Edwards valve in 2006.  3. Congestive heart failure with ischemic cardiomyopathy and mildly      reduced left ventricular systolic function.  Most recent ejection      fraction was 40%.  4. Chronic atrial fibrillation on long-term anticoagulation.  5. Hypertension.  6. Hyperlipidemia.  7. Chronic obstructive pulmonary disease.   INTERVAL HISTORY:  I saw Mr. Takagi most recently in early July.  At  that time, he was doing reasonably well.  However, since then he had  significant deterioration in his symptoms including progressive  exertional dyspnea which is now with minimal activities.  He did have  few episodes of left-sided chest aching as well.  He saw his primary  care physician Dr. Maisie Fus who increased his dose of Lasix to twice  daily.  The patient lost some weight.  However, he had no improvement in  his dyspnea.  He has no orthopnea or PND.  He does seem to be anxious.  The patient is now taking Crestor for his hyperlipidemia.  Most other  statins caused him to have significant myalgias.   MEDICATIONS:  1. Warfarin 3 mg 1.5 tablet on Monday, Wednesday, Friday and 1 tablet      on Tuesday, Thursday, Saturday, Sunday.  2. Metoprolol tartrate 50 mg twice daily.  3. Aspirin 81 mg once daily.  4. Fish oil 1200 mg twice daily.  5. Klor-Con 100 mEq  once daily.  6. Furosemide 40 mg once daily.  He took twice daily for about a week.  7. Losartan 50 mg once daily.  8. Crestor 10 mg once daily.  9. Daliresp 500 mcg once daily.   ALLERGIES:  No known drug allergies.  SIMVASTATIN AND LIPITOR caused  myalgia.  Also, LEVATOL.   PHYSICAL EXAMINATION:  VITAL SIGNS:  Weight is 228.2 pounds, blood  pressure is 109/57, pulse is 67, oxygen saturation is 98% on room air.  GENERAL:  He is a pleasant gentleman who appears to be anxious, but in  no acute distress.  HEENT:  Normocephalic, atraumatic.  NECK:  No JVD or carotid bruits.  RESPIRATORY:  Normal respiratory effort with no use of accessory  muscles.  He has few crackles at the right lung base.  CARDIOVASCULAR:  Normal PMI.  Regular rhythm with no gallops or murmurs.  ABDOMEN:  Benign, nontender, nondistended.  EXTREMITIES:  With no clubbing,  cyanosis, or edema.  SKIN:  Warm and dry with no rash.  PSYCHIATRIC:  He is alert, oriented x3, and seems slightly anxious.   An electrocardiogram was performed which showed atrial fibrillation with  evidence of old inferior myocardial infarction.  No significant ST or T-  wave changes.  We obtained labs on him which showed a creatinine of  1.39, BUN of 31, and potassium of 3.9.  INR is 2.3.  CBC showed a  hemoglobin of 13.1 and platelet count of 235.  His BNP was 280.   IMPRESSION:  1. Possible progressive angina:  The patient had significant      deterioration since his last visit.  He is currently complaining of      dyspnea with minimal activities which is a significant change from      before.  He also had few episodes of chest discomfort.  He is      getting dyspnea now even walking to the mailbox which is a      significant change in his functional status.  He does not seem to      be significantly fluid overload at this time.  His BNP is      chronically mildly elevated without significant change.  His      symptoms did not improve after  taking furosemide twice daily.  At      this time, I recommend proceeding with an echocardiogram to      evaluate his LV systolic function and his replaced aortic valve.      Due to the significant deterioration of his functional status and      progressive exertional dyspnea, I recommend proceeding with a      cardiac catheterization and possible angioplasty.  We will perform      right and left heart catheterization for a definitive diagnosis      given that most of his symptoms seems to be dyspnea.  Risks and      benefits were discussed with him.  In the meantime, we will hold      warfarin for now.  We will continue his other medications.  His      estimated GFR is 50.  This is close to his baseline.  2. Hyperlipidemia:  He seems to be tolerating Crestor better than      other statins.  We will continue with his medication for now.  3. Hypertension.  His blood pressure is well-controlled.  We will      continue with metoprolol and losartan.  Further recommendations      will follow after his cardiac workup.     Lorine Bears, MD  Electronically Signed    MA/MedQ  DD: 09/08/2010  DT: 09/09/2010  Job #: [id number]

## 2011-05-04 NOTE — Op Note (Signed)
NAMEJAIMIE, REDDITT NO.:  [id number]   MEDICAL RECORD NO.:  [id number]          PATIENT TYPE:  INP   LOCATION:  2316                         FACILITY:  [facility]   PHYSICIAN:  Kathlee Nations Trigt III, M.D.DATE OF BIRTH:  [birth year]   DATE OF PROCEDURE:  02/24/2005  DATE OF DISCHARGE:                                 OPERATIVE REPORT   PROCEDURE:  1.  Coronary artery bypass grafting x 4 (left IMA to LAD, saphenous vein      graft to right coronary artery, sequential saphenous vein graft to OM1      and OM3).  2.  Aortic valve replacement with a 23-mm pericardial Edwards valve, model      number 2700, serial number [id number].   PREOPERATIVE DIAGNOSIS:  Severe three-vessel coronary disease with acute  myocardial infarction, moderate aortic stenosis.   POSTOPERATIVE DIAGNOSIS:  Severe three-vessel coronary disease with acute  myocardial infarction, moderate aortic stenosis.   SURGEON:  Kerin Perna, M.D.   ASSISTANT:  Toribio Harbour, N.P.   ANESTHESIA:  General, by Dr. Hart Robinsons.   INDICATIONS:  The patient is a 76 year old male who developed sudden onset  of chest pain and was evaluated in the emergency room with evidence of acute  MI. He underwent emergency cardiac catheterization by Dr. Kristeen Miss,  which demonstrated a high-grade 99% stenosis of the circumflex with TIMI II  flow.  He also had left main stenosis and proximal disease of the LAD. His  right coronary had an 80% stenosis just at the bifurcation at the posterior  descending. His ejection fraction was mildly reduced. He had a transvalvular  aortic gradient of 10 to 12 mmHg and a 2-D echo was performed which showed  probably moderate aortic stenosis. He did have a history of rheumatic heart  disease as a young boy. Because of his severe three-vessel coronary disease  and probable aortic stenosis. He is felt to be candidate for surgical  evaluation and aortic valve replacement.   Prior  to surgery, I examined the patient in his ICU room and reviewed the  results of the cardiac catheterization and echo studies with the patient and  his family. I discussed indications and expected benefits of coronary bypass  surgery for treatment of his coronary disease and of aortic valve  replacement for treatment of his aortic stenosis. I reviewed with the  patient the major aspects of the planned procedure including the choice of  conduit to include mammary artery and endoscopically harvested saphenous  vein, the choice of bioprosthetic tissue aortic valve prosthesis, the use of  general anesthesia and cardiopulmonary bypass, the location of the surgical  incisions, and the expected postoperative hospital recovery. I reviewed with  the patient the risks to him of coronary bypass surgery and aortic valve  replacement including risks of MI, CVA, bleeding, infection, and death.  He  understood these implications for the surgery and agreed to proceed with  operation as planned under what I felt was an informed consent.   OPERATIVE FINDINGS:  The patient's heart was enlarged and globular. The  circumflex  vessels were small but graftable. There was evidence of a recent  acute lateral MI with myocardial edema, erythema, and friability. The  saphenous vein was of good quality, harvested endoscopically from the entire  right leg. The mammary artery was a good vessel with excellent flow. The  patient was given a unit of platelets in the operating room after reversal  of the heparin with protamine due to coagulopathy induced by preoperative  Integrilin.   PROCEDURE:  The patient was brought to operative room, placed supine on the  operating table where general anesthesia was induced under invasive  hemodynamic monitoring. The chest, abdomen and legs were prepped with  Betadine and draped as a sterile field.  A sternal incision was made as the  leg incision was made and the saphenous vein was  harvested endoscopically  from the right leg. The left internal mammary artery was harvested as a  pedicle graft from its origin at the subclavian vessels. It was a good  vessel with excellent flow. Heparin was administered and the ACT was  documented as being therapeutic for the aprotinin protocol used for this  operation. The sternal retractor was placed. The pericardium was opened and  suspended. Pursestrings were placed in the right atrium and the ascending  aorta and the patient was cannulated and placed on bypass. The coronaries  were identified for grafting. The left ventricular vent was placed via the  right superior pulmonary vein. Cardioplegia catheters were placed in both  the ascending aorta and coronary sinus for antegrade and retrograde delivery  of cold blood cardioplegia. The veins and mammary artery were prepared for  the distal anastomoses. The patient was cooled to 30 degrees. Aortic  crossclamp was applied. 800 cc of cold blood cardioplegia was delivered in  split doses between the antegrade aortic and retrograde coronary sinus  catheters. There is good cardioplegic arrest and septal temperature dropped  less than 12 degrees. Topical iced saline was used to augment myocardial  preservation and a pericardial insulator pad was used to protect the left  phrenic nerve.   The distal coronary anastomoses were performed first. The first distal  anastomosis was to the distal right coronary just past the posterior  descending bifurcation. This is a 1.5 mm vessel with proximal 80% stenosis.  Reverse saphenous vein was sewn end-to-side with running 7-0 Prolene with  good flow through the graft. The second and third distal anastomoses  consisted a sequential vein graft to the OM1 and OM3. The OM1 was a 1.5 mm  vessel  with proximal 80% stenosis. A side-to-side anastomosis with the vein  was constructed using running 7-0 Prolene.  There was good flow through graft. The third distal  anastomosis was the continuation of the sequential  vein graft to the OM3.  This is a 1.5 mm vessel with proximal 80% stenosis  and the end of the vein was sewn end-to-side with running 7-0 Prolene.  There was good flow through graft. Cardioplegia was redosed. The fourth  distal anastomosis was to the distal third of the LAD. It was deeply  embedded in a thick layer of epicardial fat.  It is a 2.0 mm vessel. The  left IMA pedicle was brought through an opening created in the left lateral  pericardium and was brought down onto the LAD and sewn end-to-side with  running 8-0 Prolene. There is excellent flow through the anastomosis after  briefly flashing the mammary pedicle bulldog. The bulldog was reapplied and  the pedicle was  secured to the epicardium. Cardioplegia was redosed.   Attention was then directed to the aortic valve. The patient had had a  transesophageal 2-D echo probe placed by the anesthesiologist which  confirmed the preoperative diagnosis of moderate and probably moderate to  severe aortic stenosis with a nonmobile, noncoronary cusp and heavy  calcifications of each leaflet. A transverse aortotomy was made and aortic  valve was inspected. It was confirmed to have moderate to severe aortic  stenosis. The leaflets were excised and the annulus was debrided of calcium.  The outflow tract was irrigated with copious amounts of cold saline. The  annulus was sized to a 23-mm pericardial tissue valve.  Subannular 2-0  mattress sutures with pledgets were placed around the annulus measuring 18  in number. These were then placed through the sewing ring of the valve,  which had been rinsed and prepared as per protocol. The valve was seated and  the sutures were tied. The valve seated well without obstruction of the  coronary ostia and there were no spaces in between the sutures and the  annulus. The aortotomy was then closed in two layers using running #4-0  Prolene.   After  cardioplegia had been redosed, two proximal vein anastomoses were  placed on the ascending aorta using a 4.0 mm punch and running 6-0 Prolene.  Air was vented from the coronaries and left side of the heart using a dose  of retrograde warm blood cardioplegia as well as the usual de-airing  maneuvers on bypass. The proximal anastomoses were tied down and the  crossclamp was removed.   The heart resumed a spontaneous rhythm. The patient was rewarmed and  reperfused. Temporary pacing wires were applied. Air was aspirated from the  vein grafts. Vein grafts and mammary artery were inspected and found to have  good flow and hemostasis was documented of the proximal and distal  anastomoses. After the patient had been reperfused and rewarmed to 37  degrees, the lungs were re-expanded and ventilator was resumed. The patient  was started on low-dose dopamine and then weaned from bypass without  difficulty. Cardiac output and blood pressure were stable. The 2-D echo showed LV function to be preserved and the bioprosthetic valve showed no  evidence of insufficiency with normal LV ejection. Protamine was  administered without adverse reaction. The cannulas were removed. The  mediastinum was irrigated with warm antibiotic irrigation. The superior  pericardial fat was closed over the aorta. Two mediastinal and a left  pleural chest tube were placed and brought out through separate incisions.  The sternum was closed with interrupted steel wire. The pectoralis fascia  was closed with a running #1 Vicryl. The subcutaneous and skin layers were  closed with a running Vicryl and sterile dressings were applied. Total  bypass time was 190 minutes, with a crossclamp time 140 minutes.      PV/MEDQ  D:  02/24/2005  T:  02/26/2005  Job:  [id number]   cc:   Vesta Mixer, M.D.  [address], [address]  [place]  [place] [address]  Fax: [phone]   [facility]

## 2011-05-04 NOTE — Cardiovascular Report (Signed)
NAMECUINN, WESTERHOLD NO.:  [id number]   MEDICAL RECORD NO.:  [id number]          PATIENT TYPE:  INP   LOCATION:  1824                         FACILITY:  [facility]   PHYSICIAN:  Vesta Mixer, M.D. DATE OF BIRTH:  1935/12/02   DATE OF PROCEDURE:  02/23/2005  DATE OF DISCHARGE:                              CARDIAC CATHETERIZATION   [name] [name] is a 76 year old gentleman who was admitted through the  emergency room this morning with episodes of chest pain since yesterday.  He  has elevated troponin levels.  He was found to have mild ST segment  elevation in the inferior lateral leads and was brought urgently to the cath  lab.   The right femoral artery was easily cannulated using a modified Seldinger  technique.   HEMODYNAMICS:  The left ventricular pressure was 162/28 with an aortic  pressure of 148/80.   He has an aortic valve gradient around 10-12 mmHg.   CORONARY ANGIOGRAPHY:  1.  Left main:  The left main has a distal stenosis of approximately 50-60%.      This stenosis appears to be somewhat hazy and unstable.  2.  Left      anterior descending:  The LAD has a proximal/mid stenosis between 60 and      70%.  This stenosis is fairly eccentric.  The first diagonal vessel      originates in the middle of this plaque and is subtotally occluded.  The      first diagonal vessel is very small and is not a candidate for      revascularization.  The mid left anterior descending artery has mild      luminal irregularities.  The distal left anterior descending artery also      has mild luminal irregularities.  2.  The left circumflex artery is subtotally occluded.  There is a very      large vessel with multiple marginal branches.  There is TIMI-1-2 flow      down the circumflex artery.  3.  The right coronary artery has severe diffuse disease.  There is a      proximal 80% stenosis.  There is multiple mid 50-60% stenosis at and a      distal 80% stenosis.  The  posterior descending artery has mild luminal      irregularities.   LEFT VENTRICULOGRAM:  Left ventriculogram was performed in a 30-RAO  position.  It reveals overall well preserved left ventricular systolic  function.  The ejection fraction is 55-60%.   COMPLICATIONS:  None.   CONCLUSIONS:  1.  Severe three-vessel coronary artery disease involving the left main.  2.  At least mild aortic stenosis with a gradient of 10-12 mmHg.   PLAN:  We will plan CV surgeons.  He will need bypass grafting as well as  possibility of an aortic valve replacement.  We will get a transthoracic  echocardiogram for further evaluation of his aortic valve.  The patient is  stable leaving the lab.  We decided not to angioplasty the circumflex artery  because of the unstable appearance of  the distal left main.      PJN/MEDQ  D:  02/23/2005  T:  02/23/2005  Job:  [id number]   cc:   Mikey Bussing, M.D.  [address]  [place]  [place] [address]

## 2011-05-04 NOTE — Discharge Summary (Signed)
[name] [name], [name] [name] NO.:  [id number]   MEDICAL RECORD NO.:  [id number]          PATIENT TYPE:  INP   LOCATION:  2001                         FACILITY:  [facility]   PHYSICIAN:  Kerin Perna, M.D.  DATE OF BIRTH:  [birth year]   DATE OF ADMISSION:  02/23/2005  DATE OF DISCHARGE:                                 DISCHARGE SUMMARY   ADDENDUM:  Noted patient was set up for home health nurse prior to discharge  home.  He was out of bed ambulating well, appetite improving, sternal  incision is dry and intact, healing well.  Leg incisions are dry and intact  and healing well too.  A followup appointment will be made with Dr. Donata Clay in 3 weeks' time. Office will contact patient.  The patient will  followup with Dr. Elease Hashimoto in 1 week for management of INR.  The patient will  obtain a PA and lateral chest x-ray at [facility] prior to  following up with Dr. Donata Clay.  He will obtain this 1 hour prior to the  appointment.  [name] received instructions on diet, _activity_________  and incisional care.  He is told no driving until released until to do so,  no heavy lifting over 5 pounds.  He was told to wash his incisions with soap  and water.  He is told to contact the office if he develops any drainage or  opening from any of his incision sites.  The patient acknowledge  understanding.  He was also told to ambulate as much as tolerated about 3-4  times per day.  He was told to continue his breathing treatment.  The  patient, again, acknowledges understanding.   DISCHARGE MEDICATIONS:  1.  Aspirin 81 mg p.o. daily.  2.  Toprol XL 25 mg p.o. daily.  3.  Lipitor 20 mg p.o. q.h.s.  4.  Tylox 1-2 tabs p.o. q.4h. p.r.n. pain.  5.  Amiodarone 200 mg p.o. b.i.d.  6.  Lasix 40 mg p.o. daily x12 days.  7.  Potassium chloride 20 mEq p.o. daily x5 days.  8.  Coumadin __________ prior to patient's discharge home.  Will check INR      prior.      KMD/MEDQ   D:  03/07/2005  T:  03/07/2005  Job:  [id number]

## 2011-05-04 NOTE — Discharge Summary (Signed)
[name] [name], [name] [name] NO.:  [id number]   MEDICAL RECORD NO.:  [id number]          PATIENT TYPE:  INP   LOCATION:  2001                         FACILITY:  [facility]   PHYSICIAN:  Kerin Perna, M.D.  DATE OF BIRTH:  1935-04-24   DATE OF ADMISSION:  02/23/2005  DATE OF DISCHARGE:  03/07/2005                                 DISCHARGE SUMMARY   PRIMARY DIAGNOSIS:  1.  Coronary artery disease with acute myocardial infarction.  2.  Aortic stenosis.   IN-HOSPITAL DIAGNOSES:  1.  Interoperative atrial fibrillation.  2.  Postoperative ileus.  3.  ETN.  4.  Venous insufficiency.   SECONDARY DIAGNOSIS:  1.  Hypertension.  2.  History of rheumatic fever with known heart murmur since childhood.   IN-HOSPITAL OPERATIONS AND PROCEDURES:  1.  Cardiac catheterization.  2.  Bilateral carotid duplex ultrasound.  3.  ABIs.  4.  Coronary artery bypass grafting x 4 with left internal mammary artery to      the left anterior descending, saphenous vein graft to the right coronary      artery, sequential saphenous vein graft to first branch of the obtuse      marginal and third branch of the obtuse marginal, aortic valve      replacement with a 23 mm pericardial valve.   HISTORY AND PHYSICAL:  [name] [name] is a 76 year old gentleman with no prior  history of coronary artery disease.  He presented to the emergency room  on  February 23, 2005, in the a.m. with complaints of chest pain.  The patient  stated he took Tylenol and an aspirin, but this did not relieve the pain.  He then presented to the emergency room.  In the emergency room, cardiac  enzymes are obtained and the patient had an elevated troponin.  He was also  found to have mild ST segment elevation in the inferolateral leads on his  EKG.  The patient was then evaluated by Dr. Elease Hashimoto and taken urgently to the  cath lab.  He underwent cardiac catheterization on February 23, 2005.  This  demonstrated a high grade 99% stenosis  of the circumflex with a TIMI II  flow.  He also had left main stenosis and proximal disease of the left  anterior descending.  His right coronary artery had an 80% stenosis at the  site of the bifurcation of the posterior descending artery.  His ejection  fraction was noted to be 55-60%.  He had a transvalvular aortic gradient of  10-12 mmHg and a 2D echo was performed which showed probable aortic  stenosis.  The patient does have a history of rheumatic heart disease dating  back to his childhood with a known heart murmur.  We were then consulted.  For details of the patient's past medical history and physical exam, please  see dictated history and physical.   HOSPITAL COURSE:  As mentioned above, the patient underwent cardiac  catheterization on February 23, 2005, revealing serious three vessel coronary  artery disease involving the left main and at least mild aortic stenosis  with a gradient  of 10-12 mmHg.  The patient was evaluated by Dr. Donata Clay  and after discussion of undergoing coronary artery bypass grafting and a  possible aortic valve repair/replacement, and undergoing the risks and  benefits of these procedures, the patient was then scheduled to undergo  coronary artery bypass grafting on February 24, 2005.  Prior to surgery, the  patient underwent bilateral carotid duplex ultrasound.  This showed the  right proximal internal carotid artery had a 60-80% stenosis.  The proximal  left internal carotid artery showed a 40-50% stenosis.  The patient also had  ABIs done which were within normal limits.  The patient was then taken to  the operating room on February 24, 2005, where he underwent coronary artery  bypass grafting x 4 with the left internal mammary artery to the left  anterior descending, saphenous vein graft to the right coronary artery,  sequential saphenous vein graft to the first branch of the obtuse marginal  and the third branch of the obtuse marginal.  The patient also  underwent  aortic valve replacement with a 23 mm pericardial Edwards valve.  The  patient developed atrial fibrillation interoperatively.  He was started on  IV Amiodarone interoperatively.  Also noted, transesophageal echocardiogram  was done post surgery.  The patient tolerated the procedure well and was  transferred up to the intensive care unit in stable condition.  He was  extubated the evening of surgery.   Postoperative day one, the patient continued to do well.  He was breathing  comfortably with clear lungs.  The patient's lines and chest tubes were  discontinued postoperative day one.  He was out of bed to chair.  The  patient did correct to normal sinus rhythm but Amiodarone was continued.  This was changed to p.o. Amiodarone on postoperative day two.  On  postoperative day two, the patient's chest x-ray showed worsening bibasilar  aeration and he was started on IV Lasix.  Also noted postoperatively, the  patient developed elevated white blood cell count.  It was felt that this  was due to possible pulmonary issues.  The patient was started on Maxipime  IV.  The patient remained on Dopamine drip on postoperative day two due to  blood pressure control, weaning at that time.  Coumadin was also started on  postoperative day two for the patient being postop aortic valve replacement.  Also noted that the patient developed a postoperative ileus.  This did  improve over time.  He had positive bowel sounds and his appetite improved.  Noted postoperatively, the patient would desaturate with activity.  He was  placed on BiPAP for a day or two postoperatively.  This did improve the  patient's saturations and breathing.  The patient was able to be weaned from  the BiPAP and placed back on nasal cannula.  The patient was also encouraged  to continue his breathing exercises and was receiving nebulizer treatments postoperatively.  Noted postoperatively, the patient's BUN and creatinine   elevated and his urine output decreased.  As stated before, the patient was  on Dopamine for blood pressure control but remained on Dopamine for renal  dosing.  Urinalysis was evaluated and showed it to be negative.  Due to the  elevated BUN, Lasix was held.  The renal Dopamine dose was able to be weaned  by the next several days.  The patient did have, as mentioned above,  pulmonary issues, problems coming off the nasal cannula.  He would  desaturation with  ambulation.  Several days post surgery, he was able to  ambulate better and use his incentive spirometer better at taking deep  breaths.  His lungs cleared up and his chest x-ray became clear.  He was off  the O2 prior to discharge with saturations 90-92%.  The remainder of the  patient's postoperative course, he was in and out of atrial fibrillation.  The patient remained on Amiodarone and will be discharged home on  Amiodarone.  At discharge, he was in normal sinus rhythm.  The patient will  also be discharged home on Coumadin which his INR levels will be followed by  Dr. Elease Hashimoto.  Prior to discharge, the patient's white blood cell count was  within normal limits.  He had been on Maxipime for eight days and he was  afebrile.  The patient will not be discharged home on any antibiotic.  Prior  to discharge, Home Health Nursing was set  up for restorative care and INR  draws.   Dictation ends here.      KMD/MEDQ  D:  03/07/2005  T:  03/07/2005  Job:  [id number]

## 2011-06-07 ENCOUNTER — Ambulatory Visit: Payer: Medicare Other | Admitting: Cardiovascular Disease

## 2011-07-16 ENCOUNTER — Other Ambulatory Visit: Payer: Self-pay | Admitting: *Deleted

## 2011-07-16 MED ORDER — METOPROLOL TARTRATE 50 MG PO TABS: 50.0000 mg | ORAL_TABLET | Freq: Two times a day (BID) | ORAL | Status: DC

## 2011-09-06 ENCOUNTER — Encounter: Payer: Self-pay | Admitting: Cardiovascular Disease

## 2011-09-10 ENCOUNTER — Encounter: Payer: Self-pay | Admitting: Cardiovascular Disease

## 2011-09-18 ENCOUNTER — Inpatient Hospital Stay (HOSPITAL_COMMUNITY)
Admission: EM | Admit: 2011-09-18 | Discharge: 2011-09-19 | DRG: 313 | Disposition: A | Discharge status: Home / Self Care | Payer: Medicare Other | Attending: Cardiology | Admitting: Cardiology

## 2011-09-18 ENCOUNTER — Emergency Department (HOSPITAL_COMMUNITY): Payer: Medicare Other

## 2011-09-18 DIAGNOSIS — Z79899 Other long term (current) drug therapy: Secondary | ICD-10-CM

## 2011-09-18 DIAGNOSIS — N289 Disorder of kidney and ureter, unspecified: Secondary | ICD-10-CM | POA: Diagnosis present

## 2011-09-18 DIAGNOSIS — I509 Heart failure, unspecified: Secondary | ICD-10-CM | POA: Diagnosis present

## 2011-09-18 DIAGNOSIS — Z7982 Long term (current) use of aspirin: Secondary | ICD-10-CM

## 2011-09-18 DIAGNOSIS — I4891 Unspecified atrial fibrillation: Secondary | ICD-10-CM | POA: Diagnosis present

## 2011-09-18 DIAGNOSIS — Z87891 Personal history of nicotine dependence: Secondary | ICD-10-CM

## 2011-09-18 DIAGNOSIS — I10 Essential (primary) hypertension: Secondary | ICD-10-CM | POA: Diagnosis present

## 2011-09-18 DIAGNOSIS — R079 Chest pain, unspecified: Secondary | ICD-10-CM

## 2011-09-18 DIAGNOSIS — I25810 Atherosclerosis of coronary artery bypass graft(s) without angina pectoris: Secondary | ICD-10-CM | POA: Diagnosis present

## 2011-09-18 DIAGNOSIS — I2510 Atherosclerotic heart disease of native coronary artery without angina pectoris: Secondary | ICD-10-CM | POA: Diagnosis present

## 2011-09-18 DIAGNOSIS — R0789 Other chest pain: Principal | ICD-10-CM | POA: Diagnosis present

## 2011-09-18 DIAGNOSIS — E785 Hyperlipidemia, unspecified: Secondary | ICD-10-CM | POA: Diagnosis present

## 2011-09-18 DIAGNOSIS — Z7901 Long term (current) use of anticoagulants: Secondary | ICD-10-CM

## 2011-09-18 DIAGNOSIS — J449 Chronic obstructive pulmonary disease, unspecified: Secondary | ICD-10-CM | POA: Diagnosis present

## 2011-09-18 DIAGNOSIS — Z952 Presence of prosthetic heart valve: Secondary | ICD-10-CM

## 2011-09-18 DIAGNOSIS — J4489 Other specified chronic obstructive pulmonary disease: Secondary | ICD-10-CM | POA: Diagnosis present

## 2011-09-18 LAB — DIFFERENTIAL
Eosinophils Absolute: 0.2 10*3/uL (ref 0.0–0.7)
Lymphocytes Relative: 31 % (ref 12–46)
Lymphs Abs: 2.8 10*3/uL (ref 0.7–4.0)
Neutrophils Relative %: 54 % (ref 43–77)

## 2011-09-18 LAB — POCT I-STAT TROPONIN I
Troponin i, poc: 0 ng/mL (ref 0.00–0.08)
Troponin i, poc: 0.02 ng/mL (ref 0.00–0.08)

## 2011-09-18 LAB — CBC
HCT: 42.8 % (ref 39.0–52.0)
MCV: 90.1 fL (ref 78.0–100.0)
Platelets: 244 10*3/uL (ref 150–400)
RBC: 4.75 MIL/uL (ref 4.22–5.81)
WBC: 9.1 10*3/uL (ref 4.0–10.5)

## 2011-09-18 LAB — POCT I-STAT, CHEM 8
Chloride: 102 mEq/L (ref 96–112)
HCT: 47 % (ref 39.0–52.0)
Potassium: 4.2 mEq/L (ref 3.5–5.1)

## 2011-09-18 LAB — CK TOTAL AND CKMB (NOT AT ARMC): Total CK: 101 U/L (ref 7–232)

## 2011-09-18 LAB — APTT: aPTT: 38 seconds — ABNORMAL HIGH (ref 24–37)

## 2011-09-19 LAB — CARDIAC PANEL(CRET KIN+CKTOT+MB+TROPI)
CK, MB: 2.8 ng/mL (ref 0.3–4.0)
Relative Index: INVALID (ref 0.0–2.5)
Relative Index: INVALID (ref 0.0–2.5)
Total CK: 96 U/L (ref 7–232)
Total CK: 95 U/L (ref 7–232)

## 2011-09-19 LAB — LIPID PANEL
HDL: 37 mg/dL — ABNORMAL LOW (ref >39)
LDL Cholesterol: 166 mg/dL — ABNORMAL HIGH (ref 0–99)
Triglycerides: 119 mg/dL (ref <150)
VLDL: 24 mg/dL (ref 0–40)

## 2011-09-19 LAB — COMPREHENSIVE METABOLIC PANEL
ALT: 14 U/L (ref 0–53)
AST: 20 U/L (ref 0–37)
Albumin: 3.3 g/dL — ABNORMAL LOW (ref 3.5–5.2)
CO2: 27 mEq/L (ref 19–32)
Calcium: 9.2 mg/dL (ref 8.4–10.5)
Chloride: 98 mEq/L (ref 96–112)
GFR calc non Af Amer: 44 mL/min — ABNORMAL LOW (ref >90)
Sodium: 136 mEq/L (ref 135–145)
Total Bilirubin: 0.4 mg/dL (ref 0.3–1.2)

## 2011-09-19 NOTE — Discharge Summary (Addendum)
NAMEBRITTAIN, [name] [name] NO.:  [id number]  MEDICAL RECORD NO.:  [id number]  LOCATION:  2030                         FACILITY:  [facility]  PHYSICIAN:  Vesta Mixer, M.D. DATE OF BIRTH:  1935/06/23  DATE OF ADMISSION:  09/18/2011 DATE OF DISCHARGE:  09/19/2011                              DISCHARGE SUMMARY   DISCHARGE DIAGNOSES: 1. Chest pain, doubt cardiac.     a.     Negative cardiac enzymes x5. 2. Chronic atrial fibrillation, on Coumadin therapy.     a.     Occasional pauses 2-3 seconds, metoprolol held for now. 3. Coronary artery disease.     a.     Status post coronary artery bypass graft in 2006 with left      internal mammary artery to left anterior descending, vein graft to      [name] [name] right coronary artery, and sequential vein graft to obtuse      marginal 1 and obtuse marginal 2.     b.     Catheterization, September 2011, revealing significant      multivessel disease in [name] [name] native vessels, with patent grafts,      though [name] [name] limb of [name] [name] sequential vein graft to [name] [name] obtuse      marginal 3 had 80% stenosis. 4. Aortic valve replacement at [name] [name] time of his bypass graft in 2000,     status post pericardial valve. 5. History of chronic diastolic heart failure with ejection fraction     of 50-55% in 2011. 6. Hypertension. 7. Hyperlipidemia with statin intolerance. 8. Chronic obstructive pulmonary disease. 9. Remote tobacco abuse.  HOSPITAL COURSE:  [name] [name] [name] is a 76 year old gentleman with a history of CAD status post CABG who was in his usual state of health until day of admission when he was at work driving cars around [name] [name] lot and had sudden onset of moderate substernal chest pain without associated symptoms.  He started driving to [name] [name] [facility] ED where EKG was nonacute. After oxygen administration, [name] [name] [name] had immediate relief of his symptoms.  Total duration was about 45 minutes.  [name] [name] [name] says [name] [name] symptoms were not at all similar to prior angina  as they were not as severe.  [name] [name] [name] was admitted to [name] [name] hospital for rule out and cardiac enzymes were cycled which were negative.  Initially, [name] [name] thought was to proceed with stress testing.  However, [name] [name] [name] did have a degree of renal insufficiency, prompting decrease in his dose of Lasix. Dr. Elease Hashimoto doubted that this event was primarily cardiac.  [name] [name] [name] did not want to do an exercise stress test.  Dr. Elease Hashimoto did not feel that catheterization at this point was warranted given his creatinine. [name] [name] [name] ambulated well without problems on day of discharge.  Dr. Elease Hashimoto has seen and examined him today and feels he is stable for discharge.  As he was observed on telemetry, he did have occasional pauses overnight up to 2.8 seconds. Per discussion with Dr. Elease Hashimoto, his metoprolol will be discontinued.  DISCHARGE LABORATORY DATA:  Sodium 136, potassium 4, chloride 98, CO2 27, glucose 113, BUN 25, and creatinine 1.5.  LFTs were normal except for albumin 3.3.  Cardiac enzymes were negative.  INR 1.92.  STUDIES:  Chest x-ray, September 18, 2011, showed cardiomegaly without edema.  Stable.  DISCHARGE MEDICATIONS: 1. Losartan 50 mg daily. 2. Coumadin 3 mg 1.5 tablets Monday and Friday and 1 tablet Tuesday,     Wednesday, Thursday, Saturday, and Sunday. 3. Fish oil 1200 mg p.o. b.i.d. 4. Metoprolol tartrate was discontinued secondary to bradycardia/pauses. 5. Lasix 40 mg daily. 6. Nitro sublingual 0.4 mg every 5 minutes as needed up to 3 doses for     chest pain. 7. Aspirin 81 mg daily. 8. Potassium chloride 10 mEq daily.  DISPOSITION:  [name] [name] [name] will be discharged in stable condition to home.  He is to follow a low-sodium heart-healthy diet, and keep his IV site clean and dry.  He is to increase his activity slowly and call or return for increased symptoms.  Our office will call him with a followup appointment for Dr. Elease Hashimoto.  He is also instructed to follow up with  his primary care doctor in regards to his kidney insufficiency and is also followed through [name] and is to ensure that he has a followup appointment within 1 week.  DURATION OF DISCHARGE ENCOUNTER:  Greater than 30 minutes including physician PA time.     Ronie Spies, P.A.C.   ______________________________ Vesta Mixer, M.D.    DD/MEDQ  D:  09/19/2011  T:  09/19/2011  Job:  [id number]  cc:   Konrad Felix, MD  Electronically Signed by Ronie Spies  on 09/19/2011 09:19:20 PM Electronically Signed by Kristeen Miss M.D. on 10/04/2011 09:51:49 AM

## 2011-09-27 NOTE — H&P (Signed)
NAMEGABRIELL, [name] [name] NO.:  [id number]  MEDICAL RECORD NO.:  [id number]  LOCATION:  2030                         FACILITY:  [facility]  PHYSICIAN:  Arturo Morton. Riley Kill, MD, FACCDATE OF BIRTH:  02-03-1935  DATE OF ADMISSION:  09/18/2011 DATE OF DISCHARGE:                             HISTORY & PHYSICAL   PRIMARY CARDIOLOGIST:  Previously, Vesta Mixer, MD and then subsequently followed by Dr. Lorine Bears, MD.  PRIMARY CARE PROVIDER:  Arturo Morton. Riley Kill, MD, [facility]  PATIENT'S PROFILE:  A 76 year old male with history of CAD, status post coronary artery bypass grafting in 2006, who presents following an episode of chest pain that resolved after oxygen administration. 1. Coronary artery disease.     a.     Status post coronary artery bypass grafting in 2006 with      placement of LIMA to the LAD, vein graft to the RCA and sequential      vein graft to the OM-1 and OM-2.     b.     Catheterization September 13, 2010, revealing significant      native multivessel disease with patent grafts, though the limb of      the sequential graft to the OM-3 had 80% stenosis. 2. Status post aortic valve replacement at the time of coronary artery     bypass grafting in 2006 with diffuse 33-mm pericardial valve. 3. History of chronic diastolic congestive heart failure, EF 50-55% in     2011. 4. Chronic atrial fibrillation on Coumadin therapy. 5. Hypertension. 6. Hyperlipidemia with statin tolerance. 7. COPD. 8. Remote tobacco abuse.  ALLERGIES:  SIMVASTATIN and CRESTOR.  HISTORY OF PRESENT ILLNESS:  A 76 year old male with the above problem list.  The patient is status post coronary artery bypass grafting in 2006 and his last catheterization was in September 2011, at which time he had 4/4 patent grafts with disease in the sequential vein graft to the OM-3.  The patient was in his usual state of health until this morning when he was at work driving cars around a lot and had  sudden onset of moderate substernal chest discomfort without associated symptoms.  Pain was no worst when he got out and walked around the car.  He started driving up to the [facility] ED, where his ECG was nonacute and point-of-care markers were negative.  After oxygen administration, the patient had immediate relief of symptoms.  Total duration was about 45 minutes.  The patient says symptoms were no way similar to prior angina as they were not nearly as severe.  HOME MEDICATIONS: 1. Losartan 50 mg daily. 2. Coumadin daily. 3. Klor-Con 10 mEq b.i.d. 4. Metoprolol 50 mg b.i.d. 5. Furosemide 40 mg b.i.d. 6. Fish oil 1200 mg b.i.d. 7. Aspirin 81 mg daily.  FAMILY HISTORY:  Mother died with history of dementia.  Father died of an MI at 2.  Brother died with history of diabetes.  Sister died of cancer.  His another sister has also deceased of unknown cause.  SOCIAL HISTORY:  The patient lives in [place] by himself.  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  Previously, he smoked, but quit in 1983.  Denies alcohol or  drug use.  He walks regularly.  REVIEW OF SYSTEMS:  Positive for chest pain, cough and intermittent wheezing.  FULL CODE:  Otherwise, all systems reviewed and negative.  PHYSICAL EXAMINATION:  VITAL SIGNS:  Temperature 98.1, heart rate 66, respirations 20, blood pressure 150/72, pulse ox 99% on room air. GENERAL:  Pleasant white male in no acute distress.  Awake, alert and oriented x3.  He has a normal affect. HEENT:  Normal.  Nares grossly intact and nonfocal. SKIN:  Warm and dry without lesions or masses. NECK:  Supple without bruits or JVD. LUNGS:  Respirations are regular and unlabored with somewhat diminished breath sounds, but otherwise clear to auscultation. CARDIAC:  Irregular regular S1 and S2, no S3, S4 or murmurs. ABDOMEN:  Round, soft, nontender and nondistended.  Bowel sounds present x4. EXTREMITIES:  Warm, dry and pink.  No clubbing, cyanosis or  edema. Dorsalis pedis, posterior tibialis pulses 2+ and equal bilaterally.  Chest x-ray shows cardiomegaly without edema and stable.  EKG shows atrial fibrillation rate of 63, normal axis, T-wave inversion in V5 which is not significant change from old EKG.  Hemoglobin 16, hematocrit 47, WBC 9.1 and platelets 244.  Sodium 139, potassium 4.2, chloride 102, CO2 26, BUN 25, creatinine 1.6, glucose 109 and troponin-I 0.02.  ASSESSMENT AND PLAN: 1. Chest pain without current objective evidence of ischemia typical     and atypical features with relief following oxygen administration.     So far enzymes are negative.  Total duration of symptoms about 45     minutes.  Plan to admit and cycle enzymes.  Enzymes remain     negative.  Consider discharge in the a.m. with outpatient Myoview     though the patient says he would prefer to avoid any stress     testing.  Continue home medications including aspirin, beta blocker     and fish oil therapy.  The patient is intolerant to statin. 2. Atrial fibrillation.  This is rate controlled on beta-blocker.     Continue Coumadin. 3. Hypertension slightly elevated here in the ER, follow on home meds     and adjust as necessary. 4. Hyperlipidemia.  The patient is statin intolerant.  We will check     lipids.  Continue fish oil. 5. Status post aortic valve replacement other then tissue valve. 6. History of diastolic heart failure.  No evidence of volume     overload. 7. Acute renal insufficiency.  His creatinine is 1.6 today.  He takes     40 b.i.d. Lasix at home.  I am going to reduce this to 40 mg daily.     Nicolasa Ducking, ANP   ______________________________ Arturo Morton. Riley Kill, MD, [facility]    CB/MEDQ  D:  09/18/2011  T:  09/19/2011  Job:  [id number]  Electronically Signed by Nicolasa Ducking ANP on 09/26/2011 07:13:01 PM Electronically Signed by Shawnie Pons MD [facility] on 09/27/2011 05:44:11 AM

## 2011-10-02 ENCOUNTER — Encounter: Payer: Self-pay | Admitting: Cardiovascular Disease

## 2012-02-04 ENCOUNTER — Encounter: Payer: Self-pay | Admitting: Cardiovascular Disease

## 2012-02-04 ENCOUNTER — Ambulatory Visit (INDEPENDENT_AMBULATORY_CARE_PROVIDER_SITE_OTHER): Payer: Medicare Other | Admitting: Cardiovascular Disease

## 2012-02-04 DIAGNOSIS — R0602 Shortness of breath: Secondary | ICD-10-CM

## 2012-02-04 DIAGNOSIS — I4891 Unspecified atrial fibrillation: Secondary | ICD-10-CM

## 2012-02-04 DIAGNOSIS — I509 Heart failure, unspecified: Secondary | ICD-10-CM

## 2012-02-04 NOTE — Patient Instructions (Signed)
Your physician wants you to follow-up in: 6 months.   You will receive a reminder letter in the mail two months in advance. If you don't receive a letter, please call our office to schedule the follow-up appointment.  Your physician has recommended that you have a pulmonary function test. Pulmonary Function Tests are a group of tests that measure how well air moves in and out of your lungs.

## 2012-02-04 NOTE — Progress Notes (Signed)
Greig Right Date of Birth  [birth year] [facility]     [place] Office  [address]    [address]   [address] [place], [place]  [address]    [place], [place]  [address] [phone]  Fax  [phone]  [phone]  Fax [phone]   1. Chronic atrial fibrillation, on Coumadin therapy.  a. Occasional pauses 2-3 seconds, metoprolol held for now.  2. . Coronary artery disease. -Status post coronary artery bypass graft in 2006 with left  internal mammary artery to left anterior descending, vein graft to  the right coronary artery, and sequential vein graft to obtuse  marginal 1 and obtuse marginal 2.  . Catheterization, September 2011, revealing significant  multivessel disease in the native vessels, with patent grafts,  though the limb of the sequential vein graft to the obtuse  marginal 3 had 80% stenosis.  3. Aortic valve replacement at the time of his bypass graft in 2000,  status post pericardial valve.  4. History of chronic diastolic heart failure with ejection fraction  of 50-55% in 2011.  5. Hypertension.  7. Hyperlipidemia with statin intolerance.  8. Chronic obstructive pulmonary disease.  9. Remote tobacco abuse. 10.  COPD   History of Present Illness:  Mr. [name] [name] is a 77 y.o. gentleman with the above noted medical hx.    Current Outpatient Prescriptions on File Prior to Visit  Medication Sig Dispense Refill  . aspirin 81 MG tablet Take 81 mg by mouth daily.        . fish oil-omega-3 fatty acids 1000 MG capsule Take 2 g by mouth daily. 1200 mg twice a day       . furosemide (LASIX) 40 MG tablet Take 40 mg by mouth 2 (two) times daily. Take 1 tablet in morning      . losartan (COZAAR) 50 MG tablet Take 50 mg by mouth daily.        . potassium chloride (KLOR-CON) 10 MEQ CR tablet Take 10 mEq by mouth 2 (two) times daily.        Marland Kitchen warfarin (COUMADIN) 5 MG tablet Take 5 mg by mouth daily. Take as directed         No Known  Allergies  Past Medical History  Diagnosis Date  . Hypertension   . COPD (chronic obstructive pulmonary disease)     chronic  . Depression   . Fatigue   . Arthritis   . Hernia NEC     hiatel  . Reflux   . Heart murmur   . Scarlet fever   . Dizziness   . CHF (congestive heart failure)     most recent EF 50-55%  . Coronary artery disease   . Arrhythmia     chronic atrial fibrillation  . Hyperlipidemia     Past Surgical History  Procedure Date  . Coronary artery bypass graft 2006    Ester  . Aortic valve replacement 2006    33-mm pericardial Edward's valve  . Cardiac catheterization 09/13/2010    patent grafts. no significant pulmonary hypertension.     History  Smoking status  . Former Smoker  . Quit date: 12/17/1980  Smokeless tobacco  . Not on file    History  Alcohol Use No    Family History  Problem Relation Age of Onset  . Cancer Mother   . Heart attack Father   . Heart attack Brother   . Cancer Brother  Reviw of Systems:  Reviewed in the HPI.  All other systems are negative.  Physical Exam: Blood pressure 131/63, pulse 82, height 5\' 11"  (1.803 m), weight 226 lb 6.4 oz (102.694 kg). General: Well developed, well nourished, in no acute distress.  Head: Normocephalic, atraumatic, sclera non-icteric, mucus membranes are moist,   Neck: Supple. Negative for carotid bruits. JVD not elevated.  Lungs: Clear bilaterally to auscultation without wheezes, rales, or rhonchi. Breathing is unlabored.  Heart: irregularly irregular with S1 S2. No murmurs, rubs, or gallops appreciated.  Abdomen: Soft, non-tender, non-distended with normoactive bowel sounds. No hepatomegaly. No rebound/guarding. No obvious abdominal masses.  Msk:  Strength and tone appear normal for age.  Extremities: No clubbing or cyanosis. No edema.  Distal pedal pulses are 2+ and equal bilaterally.  Neuro: Alert and oriented X 3. Moves all extremities spontaneously.  Psych:   Responds to questions appropriately with a normal affect.  ECG:  Assessment / Plan:

## 2012-02-04 NOTE — Assessment & Plan Note (Signed)
[name] has chronic stable A-Fib. He is asymptomatic.  Continue with current meds.

## 2012-02-04 NOTE — Assessment & Plan Note (Signed)
He has chronic shortness of breath. His ejection fraction is only mildly depressed. I suspect that a lot of his shortness breath is due to COPD. We will obtain a set of pulmonary function test. I've asked him to avoid eating any extra salt.

## 2012-02-08 ENCOUNTER — Ambulatory Visit (HOSPITAL_COMMUNITY)
Admission: RE | Admit: 2012-02-08 | Discharge: 2012-02-08 | Disposition: A | Discharge status: Home / Self Care | Payer: Medicare Other | Source: Ambulatory Visit | Attending: Cardiovascular Disease | Admitting: Cardiovascular Disease

## 2012-02-08 DIAGNOSIS — I509 Heart failure, unspecified: Secondary | ICD-10-CM

## 2012-02-08 DIAGNOSIS — R0602 Shortness of breath: Secondary | ICD-10-CM | POA: Insufficient documentation

## 2012-02-08 LAB — PULMONARY FUNCTION TEST

## 2012-02-08 MED ORDER — ALBUTEROL SULFATE (5 MG/ML) 0.5% IN NEBU
2.5000 mg | INHALATION_SOLUTION | Freq: Once | RESPIRATORY_TRACT | Status: AC
  Administered 2012-02-08: 2.5 mg via RESPIRATORY_TRACT

## 2012-02-18 ENCOUNTER — Telehealth: Payer: Self-pay | Admitting: Cardiovascular Disease

## 2012-02-18 DIAGNOSIS — R942 Abnormal results of pulmonary function studies: Secondary | ICD-10-CM

## 2012-02-18 DIAGNOSIS — R0602 Shortness of breath: Secondary | ICD-10-CM

## 2012-02-18 NOTE — Telephone Encounter (Signed)
Called family member and explained that i was unable to see it in his electronic chart and i will call tomorrow, sorry for inconvenience. I have left a msg with [facility] pulmonary lab.

## 2012-02-18 NOTE — Telephone Encounter (Signed)
Pt had pulmonary test 02-08-12, pt's dtr calling for results

## 2012-02-20 NOTE — Telephone Encounter (Signed)
Daughter called said he still was having SOB, referral made for pulmonology. Family accepting of plan. Told her to call back if we don't contact within two days.

## 2012-02-20 NOTE — Telephone Encounter (Signed)
FU Call: Pt daughter calling back wanting to speak with nurse regarding results of pt pulmonary test. Please return call to discuss further.

## 2012-02-21 ENCOUNTER — Telehealth: Payer: Self-pay | Admitting: *Deleted

## 2012-02-21 NOTE — Telephone Encounter (Signed)
Fu call Patient called back and said he is'nt financially able to keep appt with pulmonary dr. Please call him back

## 2012-02-21 NOTE — Telephone Encounter (Signed)
Called Dr Shelle Iron staff and cancelled app.

## 2012-02-21 NOTE — Telephone Encounter (Signed)
[name] [name] has decided to cancel his appointment with Dr. Shelle Iron. Right now for financial reason he is unable to keep the appointment.

## 2012-02-21 NOTE — Telephone Encounter (Signed)
Spoke with daughter, they now decline app with Dr  clance due to finances. Informed he it isnt in his best interest and she verbalized understanding.

## 2012-02-22 ENCOUNTER — Other Ambulatory Visit: Payer: Self-pay | Admitting: Cardiovascular Disease

## 2012-02-25 ENCOUNTER — Other Ambulatory Visit: Payer: Self-pay

## 2012-02-25 MED ORDER — LOSARTAN POTASSIUM 50 MG PO TABS: 50.0000 mg | ORAL_TABLET | Freq: Every day | ORAL | Status: DC

## 2012-03-07 ENCOUNTER — Institutional Professional Consult (permissible substitution): Payer: Medicare Other | Admitting: Pulmonary Disease

## 2012-04-22 ENCOUNTER — Other Ambulatory Visit: Payer: Self-pay | Admitting: Cardiovascular Disease

## 2012-04-22 MED ORDER — LOSARTAN POTASSIUM 50 MG PO TABS: 50.0000 mg | ORAL_TABLET | Freq: Every day | ORAL | Status: DC

## 2012-04-22 NOTE — Telephone Encounter (Signed)
Refill - Losartn 50 MG Tablet (Patient request 90 day supply)   Daughter Maud Deed verified preferred as [facility] in [place], [place]

## 2012-08-29 ENCOUNTER — Encounter: Payer: Self-pay | Admitting: Cardiovascular Disease

## 2013-01-19 ENCOUNTER — Other Ambulatory Visit: Payer: Self-pay

## 2013-01-19 MED ORDER — LOSARTAN POTASSIUM 50 MG PO TABS: 50.0000 mg | ORAL_TABLET | Freq: Every day | ORAL | Status: DC

## 2013-02-17 ENCOUNTER — Telehealth: Payer: Self-pay | Admitting: Cardiovascular Disease

## 2013-02-17 NOTE — Telephone Encounter (Signed)
PER DR NAHSER, PT MAY SWITCH, PT HAS BOVINE VALVE AND IS NOT BEING TREATED WITH ANTICOAGULANTS FOR AVR BUT FOR AFIB.

## 2013-02-17 NOTE — Telephone Encounter (Signed)
New Problem:    Patient's daughter called because the patient's PCP wants to change his warfarin to [facility] and wanted to know if that would be ok and if he needed to visit before beginning that regimen.  Please call back.

## 2013-03-30 ENCOUNTER — Other Ambulatory Visit: Payer: Self-pay | Admitting: *Deleted

## 2013-03-30 MED ORDER — LOSARTAN POTASSIUM 50 MG PO TABS: 50.0000 mg | ORAL_TABLET | Freq: Every day | ORAL | Status: DC

## 2013-03-30 NOTE — Telephone Encounter (Signed)
Fax Received. Refill Completed. [name] [name] (R.M.A)  No refills until appointment

## 2013-05-25 ENCOUNTER — Emergency Department (HOSPITAL_COMMUNITY): Payer: Medicare Other

## 2013-05-25 ENCOUNTER — Encounter (HOSPITAL_COMMUNITY): Payer: Self-pay | Admitting: Emergency Medicine

## 2013-05-25 ENCOUNTER — Emergency Department (HOSPITAL_COMMUNITY)
Admission: EM | Admit: 2013-05-25 | Discharge: 2013-05-25 | Disposition: A | Discharge status: Home / Self Care | Payer: Medicare Other | Attending: Emergency Medicine | Admitting: Emergency Medicine

## 2013-05-25 DIAGNOSIS — R011 Cardiac murmur, unspecified: Secondary | ICD-10-CM | POA: Insufficient documentation

## 2013-05-25 DIAGNOSIS — F411 Generalized anxiety disorder: Secondary | ICD-10-CM | POA: Insufficient documentation

## 2013-05-25 DIAGNOSIS — M7989 Other specified soft tissue disorders: Secondary | ICD-10-CM | POA: Insufficient documentation

## 2013-05-25 DIAGNOSIS — I499 Cardiac arrhythmia, unspecified: Secondary | ICD-10-CM | POA: Insufficient documentation

## 2013-05-25 DIAGNOSIS — J441 Chronic obstructive pulmonary disease with (acute) exacerbation: Secondary | ICD-10-CM | POA: Insufficient documentation

## 2013-05-25 DIAGNOSIS — Z8719 Personal history of other diseases of the digestive system: Secondary | ICD-10-CM | POA: Insufficient documentation

## 2013-05-25 DIAGNOSIS — Z9861 Coronary angioplasty status: Secondary | ICD-10-CM | POA: Insufficient documentation

## 2013-05-25 DIAGNOSIS — R059 Cough, unspecified: Secondary | ICD-10-CM | POA: Insufficient documentation

## 2013-05-25 DIAGNOSIS — R609 Edema, unspecified: Secondary | ICD-10-CM | POA: Insufficient documentation

## 2013-05-25 DIAGNOSIS — I2510 Atherosclerotic heart disease of native coronary artery without angina pectoris: Secondary | ICD-10-CM | POA: Insufficient documentation

## 2013-05-25 DIAGNOSIS — R05 Cough: Secondary | ICD-10-CM | POA: Insufficient documentation

## 2013-05-25 DIAGNOSIS — Z7901 Long term (current) use of anticoagulants: Secondary | ICD-10-CM | POA: Insufficient documentation

## 2013-05-25 DIAGNOSIS — I10 Essential (primary) hypertension: Secondary | ICD-10-CM | POA: Insufficient documentation

## 2013-05-25 DIAGNOSIS — R350 Frequency of micturition: Secondary | ICD-10-CM | POA: Insufficient documentation

## 2013-05-25 DIAGNOSIS — E785 Hyperlipidemia, unspecified: Secondary | ICD-10-CM | POA: Insufficient documentation

## 2013-05-25 DIAGNOSIS — Z8619 Personal history of other infectious and parasitic diseases: Secondary | ICD-10-CM | POA: Insufficient documentation

## 2013-05-25 DIAGNOSIS — F3289 Other specified depressive episodes: Secondary | ICD-10-CM | POA: Insufficient documentation

## 2013-05-25 DIAGNOSIS — Z954 Presence of other heart-valve replacement: Secondary | ICD-10-CM | POA: Insufficient documentation

## 2013-05-25 DIAGNOSIS — I509 Heart failure, unspecified: Secondary | ICD-10-CM

## 2013-05-25 DIAGNOSIS — M129 Arthropathy, unspecified: Secondary | ICD-10-CM | POA: Insufficient documentation

## 2013-05-25 DIAGNOSIS — Z8669 Personal history of other diseases of the nervous system and sense organs: Secondary | ICD-10-CM | POA: Insufficient documentation

## 2013-05-25 DIAGNOSIS — R450 Nervousness: Secondary | ICD-10-CM | POA: Insufficient documentation

## 2013-05-25 DIAGNOSIS — F329 Major depressive disorder, single episode, unspecified: Secondary | ICD-10-CM | POA: Insufficient documentation

## 2013-05-25 DIAGNOSIS — Z7982 Long term (current) use of aspirin: Secondary | ICD-10-CM | POA: Insufficient documentation

## 2013-05-25 DIAGNOSIS — R42 Dizziness and giddiness: Secondary | ICD-10-CM | POA: Insufficient documentation

## 2013-05-25 DIAGNOSIS — Z79899 Other long term (current) drug therapy: Secondary | ICD-10-CM | POA: Insufficient documentation

## 2013-05-25 LAB — CBC WITH DIFFERENTIAL/PLATELET
Basophils Absolute: 0.1 10*3/uL (ref 0.0–0.1)
Basophils Relative: 1 % (ref 0–1)
Eosinophils Absolute: 0.3 10*3/uL (ref 0.0–0.7)
Eosinophils Relative: 3 % (ref 0–5)
HCT: 36.4 % — ABNORMAL LOW (ref 39.0–52.0)
Hemoglobin: 12.1 g/dL — ABNORMAL LOW (ref 13.0–17.0)
MCH: 30 pg (ref 26.0–34.0)
MCHC: 33.2 g/dL (ref 30.0–36.0)
MCV: 90.3 fL (ref 78.0–100.0)
Monocytes Absolute: 1.3 10*3/uL — ABNORMAL HIGH (ref 0.1–1.0)
Monocytes Relative: 15 % — ABNORMAL HIGH (ref 3–12)
RDW: 13.5 % (ref 11.5–15.5)

## 2013-05-25 LAB — URINALYSIS, ROUTINE W REFLEX MICROSCOPIC
Glucose, UA: NEGATIVE mg/dL
Ketones, ur: NEGATIVE mg/dL
Leukocytes, UA: NEGATIVE
Nitrite: NEGATIVE
Specific Gravity, Urine: 1.004 — ABNORMAL LOW (ref 1.005–1.030)
pH: 7.5 (ref 5.0–8.0)

## 2013-05-25 LAB — COMPREHENSIVE METABOLIC PANEL
AST: 46 U/L — ABNORMAL HIGH (ref 0–37)
Albumin: 3.5 g/dL (ref 3.5–5.2)
BUN: 14 mg/dL (ref 6–23)
Calcium: 9.1 mg/dL (ref 8.4–10.5)
Chloride: 103 mEq/L (ref 96–112)
Creatinine, Ser: 1.21 mg/dL (ref 0.50–1.35)
Total Bilirubin: 0.4 mg/dL (ref 0.3–1.2)
Total Protein: 7.1 g/dL (ref 6.0–8.3)

## 2013-05-25 LAB — TROPONIN I: Troponin I: <0.3 ng/mL (ref <0.30)

## 2013-05-25 LAB — PRO B NATRIURETIC PEPTIDE: Pro B Natriuretic peptide (BNP): 4765 pg/mL — ABNORMAL HIGH (ref 0–450)

## 2013-05-25 NOTE — ED Notes (Signed)
Pt ambulated to bathroom independently with no noted difficulty. Denies SOB, dizziness or lightheadedness.

## 2013-05-25 NOTE — ED Notes (Signed)
Pt states has had increased shortness of breath for the past few days- complaining of feeling weak as well.  Pt states he has been anxious since being short of breath.  Hx of anxiety.  Pt on cardiac monitor at present, will continue to monitor pt.

## 2013-05-25 NOTE — ED Notes (Signed)
MD at bedside. 

## 2013-05-25 NOTE — ED Notes (Signed)
Pt brought to ED via [facility] from home.  Pt presents with anxiety since last night, shortness of breath associated with symptoms.  96% on RA, EMS placed pt on 2L O2 for comfort, lung sounds clear throughout.

## 2013-05-26 NOTE — ED Provider Notes (Signed)
History     CSN: [id number]  Arrival date & time 05/25/13  2004   First MD Initiated Contact with Patient 05/25/13 2059      Chief Complaint  Patient presents with  . Anxiety  . Shortness of Breath    (Consider location/radiation/quality/duration/timing/severity/associated sxs/prior treatment) HPI Pt with SOB last evening while lying flat. States he has had increased DOE and lower ext edema. No chest pain, fever, chills. Chronic cough. Pt admits to only taking 1/2-1/4 of prescribed lasix dose.  Past Medical History  Diagnosis Date  . Hypertension   . COPD (chronic obstructive pulmonary disease)     chronic  . Depression   . Fatigue   . Arthritis   . Hernia NEC     hiatel  . Reflux   . Heart murmur   . Scarlet fever   . Dizziness   . CHF (congestive heart failure)     most recent EF 50-55%  . Coronary artery disease   . Arrhythmia     chronic atrial fibrillation  . Hyperlipidemia     Past Surgical History  Procedure Laterality Date  . Coronary artery bypass graft  2006    [place]  . Aortic valve replacement  2006    33-mm pericardial Edward's valve  . Cardiac catheterization  09/13/2010    patent grafts. no significant pulmonary hypertension.     Family History  Problem Relation Age of Onset  . Cancer Mother   . Heart attack Father   . Heart attack Brother   . Cancer Brother     History  Substance Use Topics  . Smoking status: Former Smoker    Quit date: 12/17/1980  . Smokeless tobacco: Not on file  . Alcohol Use: No      Review of Systems  Constitutional: Negative for fever and chills.  HENT: Negative for congestion, sore throat, rhinorrhea and neck pain.   Respiratory: Positive for cough and shortness of breath. Negative for wheezing.   Cardiovascular: Positive for leg swelling. Negative for chest pain and palpitations.  Gastrointestinal: Negative for nausea, vomiting, abdominal pain, diarrhea and constipation.  Genitourinary: Positive for  frequency. Negative for dysuria and flank pain.  Musculoskeletal: Negative for myalgias and back pain.  Skin: Negative for rash and wound.  Neurological: Positive for light-headedness. Negative for dizziness, weakness, numbness and headaches.  Psychiatric/Behavioral: The patient is nervous/anxious.   All other systems reviewed and are negative.    Allergies  Review of patient's allergies indicates no known allergies.  Home Medications   Current Outpatient Rx  Name  Route  Sig  Dispense  Refill  . aspirin 81 MG tablet   Oral   Take 81 mg by mouth daily.           . febuxostat (ULORIC) 40 MG tablet   Oral   Take 40 mg by mouth daily.         . furosemide (LASIX) 40 MG tablet   Oral   Take 20 mg by mouth daily.          Marland Kitchen gabapentin (NEURONTIN) 100 MG capsule   Oral   Take 100 mg by mouth 3 (three) times daily.         Marland Kitchen losartan (COZAAR) 50 MG tablet   Oral   Take 1 tablet (50 mg total) by mouth daily.   30 tablet   3     NO REFILLS UNTIL 06-15-13 @ 9:15AM   . Omega-3 Fatty Acids (FISH OIL)  1200 MG CAPS   Oral   Take 1,200 mg by mouth 2 (two) times daily.         . potassium chloride (KLOR-CON) 10 MEQ CR tablet   Oral   Take 20 mEq by mouth daily.          Marland Kitchen warfarin (COUMADIN) 3 MG tablet   Oral   Take 3-4.5 mg by mouth every morning. Pt takes 1.5 tabs on tue, wed, thu, sat and sun and 1 tab on mon and fri           BP 140/78  Pulse 80  Temp(Src) 97.5 F (36.4 C) (Oral)  Resp 10  Ht 5\' 10"  (1.778 m)  Wt 220 lb (99.791 kg)  BMI 31.57 kg/m2  SpO2 96%  Physical Exam  Nursing note and vitals reviewed. Constitutional: He is oriented to person, place, and time. He appears well-developed and well-nourished. No distress.  HENT:  Head: Normocephalic and atraumatic.  Mouth/Throat: Oropharynx is clear and moist.  Eyes: EOM are normal. Pupils are equal, round, and reactive to light.  Neck: Normal range of motion. Neck supple.  Cardiovascular:  Normal rate and regular rhythm.   Pulmonary/Chest: Effort normal. No respiratory distress. He has no wheezes. He has rales. He exhibits no tenderness.  bl crackles to bases.   Abdominal: Soft. Bowel sounds are normal. He exhibits no distension and no mass. There is no tenderness. There is no rebound and no guarding.  Musculoskeletal: Normal range of motion. He exhibits edema (1+ bl ankle edema). He exhibits no tenderness.  Neurological: He is alert and oriented to person, place, and time.  Skin: Skin is warm and dry. No rash noted. No erythema.  Psychiatric: He has a normal mood and affect. His behavior is normal.    ED Course  Procedures (including critical care time)  Labs Reviewed  CBC WITH DIFFERENTIAL - Abnormal; Notable for the following:    RBC 4.03 (*)    Hemoglobin 12.1 (*)    HCT 36.4 (*)    Monocytes Relative 15 (*)    Monocytes Absolute 1.3 (*)    All other components within normal limits  COMPREHENSIVE METABOLIC PANEL - Abnormal; Notable for the following:    Glucose, Bld 102 (*)    AST 46 (*)    GFR calc non Af Amer 56 (*)    GFR calc Af Amer 65 (*)    All other components within normal limits  PRO B NATRIURETIC PEPTIDE - Abnormal; Notable for the following:    Pro B Natriuretic peptide (BNP) 4765.0 (*)    All other components within normal limits  URINALYSIS, ROUTINE W REFLEX MICROSCOPIC - Abnormal; Notable for the following:    Specific Gravity, Urine 1.004 (*)    All other components within normal limits  TROPONIN I   Dg Chest 2 View  05/25/2013   *RADIOLOGY REPORT*  Clinical Data: Shortness of breath and anxiety.  History COPD.  CHEST - 2 VIEW  Comparison: 09/18/2011  Findings: Stable chronic lung disease and hyperinflation.  No focal infiltrate, pulmonary edema or significant pleural fluid.  Heart size is stable and moderately enlarged with evidence of prior CABG and aortic valve replacement.  Degenerative changes are present in the thoracic spine.  IMPRESSION:  No active disease.  Stable COPD.   Original Report Authenticated By: Irish Lack, M.D.     1. Heart failure, congestive, etiology unknown      Date: 05/26/2013  Rate: 79  Rhythm: atrial fibrillation  QRS Axis: normal  Intervals: normal  ST/T Wave abnormalities: nonspecific T wave changes  Conduction Disutrbances:none  Narrative Interpretation:   Old EKG Reviewed: unchanged    MDM  Pt advised to take prescribed dose of lasix and f/u with his cardiologist as previously scheduled.         Loren Racer, MD 05/26/13 [phone]

## 2013-06-09 ENCOUNTER — Encounter: Payer: Self-pay | Admitting: Cardiovascular Disease

## 2013-06-15 ENCOUNTER — Ambulatory Visit (INDEPENDENT_AMBULATORY_CARE_PROVIDER_SITE_OTHER): Payer: Medicare Other | Admitting: Cardiovascular Disease

## 2013-06-15 ENCOUNTER — Encounter: Payer: Self-pay | Admitting: Cardiovascular Disease

## 2013-06-15 VITALS — BP 138/86 | HR 78 | Ht 70.0 in | Wt 219.1 lb

## 2013-06-15 DIAGNOSIS — I509 Heart failure, unspecified: Secondary | ICD-10-CM

## 2013-06-15 DIAGNOSIS — I5022 Chronic systolic (congestive) heart failure: Secondary | ICD-10-CM

## 2013-06-15 DIAGNOSIS — I4891 Unspecified atrial fibrillation: Secondary | ICD-10-CM

## 2013-06-15 DIAGNOSIS — E785 Hyperlipidemia, unspecified: Secondary | ICD-10-CM

## 2013-06-15 DIAGNOSIS — I2510 Atherosclerotic heart disease of native coronary artery without angina pectoris: Secondary | ICD-10-CM

## 2013-06-15 NOTE — Assessment & Plan Note (Signed)
He is stable .  No angina  

## 2013-06-15 NOTE — Progress Notes (Signed)
[name] [name] Date of Birth  04/30/1935 [facility]      Office  [address]    [address]   [address] [place], [place]  [address]    [place], [place]  [address] [phone]  Fax  [phone]  [phone]  Fax [phone]   1. Chronic atrial fibrillation, on Coumadin therapy.  a. Occasional pauses 2-3 seconds, metoprolol held for now.  2. . Coronary artery disease. -Status post coronary artery bypass graft in 2006 with left  internal mammary artery to left anterior descending, vein graft to  the [name] coronary artery, and sequential vein graft to obtuse  marginal 1 and obtuse marginal 2.  . Catheterization, September 2011, revealing significant  multivessel disease in the native vessels, with patent grafts,  though the limb of the sequential vein graft to the obtuse  marginal 3 had 80% stenosis.  3. Aortic valve replacement at the time of his bypass graft in 2000,  status post pericardial valve.  4. History of chronic diastolic heart failure with ejection fraction  of 50-55% in 2011.  5. Hypertension.  7. Hyperlipidemia with statin intolerance.  8. Chronic obstructive pulmonary disease.  9. Remote tobacco abuse. 10.  COPD   History of Present Illness:  [name] [name] is a 78 y.o. gentleman with the above noted medical hx.    June 15, 2013:  [name] [name] is doing well.  He had some CP and went to the ER several weeks ago.  Was diagnosed with CHF ( elevated BNP).  Was sent home.  He still has some dyspnea.  He can lie down flat.  He does not sleep well at night.     Current Outpatient Prescriptions on File Prior to Visit  Medication Sig Dispense Refill  . aspirin 81 MG tablet Take 81 mg by mouth daily.        . furosemide (LASIX) 40 MG tablet Take 20 mg by mouth daily.       Marland Kitchen losartan (COZAAR) 50 MG tablet Take 1 tablet (50 mg total) by mouth daily.  30 tablet  3  . Omega-3 Fatty Acids (FISH OIL) 1200 MG CAPS Take 1,200 mg by mouth 2  (two) times daily.      . potassium chloride (KLOR-CON) 10 MEQ CR tablet Take 20 mEq by mouth daily.       Marland Kitchen warfarin (COUMADIN) 3 MG tablet Take 3-4.5 mg by mouth every morning. Pt takes 1.5 tabs on tue, wed, thu, sat and sun and 1 tab on mon and fri       No current facility-administered medications on file prior to visit.    No Known Allergies  Past Medical History  Diagnosis Date  . Hypertension   . COPD (chronic obstructive pulmonary disease)     chronic  . Depression   . Fatigue   . Arthritis   . Hernia NEC     hiatel  . Reflux   . Heart murmur   . Scarlet fever   . Dizziness   . CHF (congestive heart failure)     most recent EF 50-55%  . Coronary artery disease   . Arrhythmia     chronic atrial fibrillation  . Hyperlipidemia     Past Surgical History  Procedure Laterality Date  . Coronary artery bypass graft  2006      . Aortic valve replacement  2006    33-mm pericardial Edward's valve  . Cardiac catheterization  09/13/2010  patent grafts. no significant pulmonary hypertension.     History  Smoking status  . Former Smoker  . Quit date: 12/17/1980  Smokeless tobacco  . Not on file    History  Alcohol Use No    Family History  Problem Relation Age of Onset  . Cancer Mother   . Heart attack Father   . Heart attack Brother   . Cancer Brother     Reviw of Systems:  Reviewed in the HPI.  All other systems are negative.  Physical Exam: Blood pressure 138/86, pulse 78, height 5\' 10"  (1.778 m), weight 219 lb 1.9 oz (99.392 kg), SpO2 97.00%. General: Well developed, well nourished, in no acute distress.  Head: Normocephalic, atraumatic, sclera non-icteric, mucus membranes are moist,   Neck: Supple. Negative for carotid bruits. JVD not elevated.  Lungs: Clear bilaterally to auscultation without wheezes, rales, or rhonchi. Breathing is unlabored.  Heart: irregularly irregular with S1 S2. No murmurs, rubs, or gallops  appreciated.  Abdomen: Soft, non-tender, non-distended with normoactive bowel sounds. No hepatomegaly. No rebound/guarding. No obvious abdominal masses.  Msk:  Strength and tone appear normal for age.  Extremities: No clubbing or cyanosis. No edema.  Distal pedal pulses are 2+ and equal bilaterally.  Neuro: Alert and oriented X 3. Moves all extremities spontaneously.  Psych:  Responds to questions appropriately with a normal affect.  ECG: June 30, j2014:  Atrial fib at 41,  NS T wave abnormality,  Assessment / Plan:

## 2013-06-15 NOTE — Patient Instructions (Addendum)
Ask your pharmacy about starting Xarelto 20 mg a day instead of warfarin.    Your physician has requested that you have an echocardiogram. Echocardiography is a painless test that uses sound waves to create images of your heart. It provides your doctor with information about the size and shape of your heart and how well your heart's chambers and valves are working. This procedure takes approximately one hour. There are no restrictions for this procedure.   Your physician wants you to follow-up in: 6 months You will receive a reminder letter in the mail two months in advance. If you don't receive a letter, please call our office to schedule the follow-up appointment.   Your physician recommends that you return for a FASTING lipid profile: 6 months bmet lipid liverREDUCE HIGH SODIUM FOODS LIKE CANNED SOUP, GRAVY, SAUCES, READY PREPARED FOODS LIKE FROZEN FOODS; LEAN CUISINE, LASAGNA. BACON, SAUSAGE, LUNCH MEAT, FAST FOODS.Marland Kitchenchips hotdogs  DASH Diet The DASH diet stands for "Dietary Approaches to Stop Hypertension." It is a healthy eating plan that has been shown to reduce high blood pressure (hypertension) in as little as 14 days, while also possibly providing other significant health benefits. These other health benefits include reducing the risk of breast cancer after menopause and reducing the risk of type 2 diabetes, heart disease, colon cancer, and stroke. Health benefits also include weight loss and slowing kidney failure in patients with chronic kidney disease.  DIET GUIDELINES  Limit salt (sodium). Your diet should contain less than 1500 mg of sodium daily.  Limit refined or processed carbohydrates. Your diet should include mostly whole grains. Desserts and added sugars should be used sparingly.  Include small amounts of heart-healthy fats. These types of fats include nuts, oils, and tub margarine. Limit saturated and trans fats. These fats have been shown to be harmful in the body. CHOOSING  FOODS  The following food groups are based on a 2000 calorie diet. See your Registered Dietitian for individual calorie needs. Grains and Grain Products (6 to 8 servings daily)  Eat More Often: Whole-wheat bread, brown rice, whole-grain or wheat pasta, quinoa, popcorn without added fat or salt (air popped).  Eat Less Often: White bread, white pasta, white rice, cornbread. Vegetables (4 to 5 servings daily)  Eat More Often: Fresh, frozen, and canned vegetables. Vegetables may be raw, steamed, roasted, or grilled with a minimal amount of fat.  Eat Less Often/Avoid: Creamed or fried vegetables. Vegetables in a cheese sauce. Fruit (4 to 5 servings daily)  Eat More Often: All fresh, canned (in natural juice), or frozen fruits. Dried fruits without added sugar. One hundred percent fruit juice ( cup [237 mL] daily).  Eat Less Often: Dried fruits with added sugar. Canned fruit in light or heavy syrup. [employer], Fish, and Poultry (2 servings or less daily. One serving is 3 to 4 oz [85-114 g]).  Eat More Often: Ninety percent or leaner ground beef, tenderloin, sirloin. Round cuts of beef, chicken breast, [place] breast. All fish. Grill, bake, or broil your meat. Nothing should be fried.  Eat Less Often/Avoid: Fatty cuts of meat, [place], or chicken leg, thigh, or wing. Fried cuts of meat or fish. Dairy (2 to 3 servings)  Eat More Often: Low-fat or fat-free milk, low-fat plain or light yogurt, reduced-fat or part-skim cheese.  Eat Less Often/Avoid: Milk (whole, 2%).Whole milk yogurt. Full-fat cheeses. Nuts, Seeds, and Legumes (4 to 5 servings per week)  Eat More Often: All without added salt.  Eat Less Often/Avoid: Salted  nuts and seeds, canned beans with added salt. Fats and Sweets (limited)  Eat More Often: Vegetable oils, tub margarines without trans fats, sugar-free gelatin. Mayonnaise and salad dressings.  Eat Less Often/Avoid: Coconut oils, palm oils, butter, stick margarine, cream,  half and half, cookies, candy, pie. FOR MORE INFORMATION The Dash Diet Eating Plan: www.dashdiet.org Document Released: 11/22/2011 Document Revised: 02/25/2012 Document Reviewed: 11/22/2011 [facility] Patient Information 2014 [place], [place].

## 2013-06-15 NOTE — Assessment & Plan Note (Signed)
Mr. [name] [name] presents today for followup visit. He was seen in the emergency room several weeks ago and was diagnosed with congestive heart failure. He is feeling better now. He clearly still eats a lot of salt. He regularly eats hot dogs.  We'll give him information on the DASH diet. We'll continue the same medications. I'll see him again in 6 months for an office visit and fasting lipids, liver enzymes, and HFP.

## 2013-06-23 ENCOUNTER — Other Ambulatory Visit (HOSPITAL_COMMUNITY): Payer: Medicare Other

## 2013-07-14 ENCOUNTER — Telehealth: Payer: Self-pay | Admitting: *Deleted

## 2013-07-14 NOTE — Telephone Encounter (Signed)
Message copied by Antony Odea on Tue Jul 14, 2013 12:11 PM ------      Message from: Mariane Masters D      Created: Mon Jun 22, 2013  8:10 AM      Regarding: ECHO       06/22/13 Patient cancel. ------

## 2013-07-14 NOTE — Telephone Encounter (Signed)
PT AWARE ECHO ORDER IS AVAILABLE, UNABLE TO PAY AND DECLINES TO HAVE TEST.

## 2014-05-02 ENCOUNTER — Emergency Department (HOSPITAL_COMMUNITY): Payer: Medicare Other

## 2014-05-02 ENCOUNTER — Emergency Department (HOSPITAL_COMMUNITY)
Admission: EM | Admit: 2014-05-02 | Discharge: 2014-05-02 | Discharge status: Against Medical Advice | Payer: Medicare Other | Attending: Emergency Medicine | Admitting: Emergency Medicine

## 2014-05-02 DIAGNOSIS — I249 Acute ischemic heart disease, unspecified: Secondary | ICD-10-CM

## 2014-05-02 DIAGNOSIS — Z7901 Long term (current) use of anticoagulants: Secondary | ICD-10-CM | POA: Insufficient documentation

## 2014-05-02 DIAGNOSIS — Z8619 Personal history of other infectious and parasitic diseases: Secondary | ICD-10-CM | POA: Insufficient documentation

## 2014-05-02 DIAGNOSIS — Z8639 Personal history of other endocrine, nutritional and metabolic disease: Secondary | ICD-10-CM | POA: Insufficient documentation

## 2014-05-02 DIAGNOSIS — Z951 Presence of aortocoronary bypass graft: Secondary | ICD-10-CM | POA: Insufficient documentation

## 2014-05-02 DIAGNOSIS — Z862 Personal history of diseases of the blood and blood-forming organs and certain disorders involving the immune mechanism: Secondary | ICD-10-CM | POA: Insufficient documentation

## 2014-05-02 DIAGNOSIS — I10 Essential (primary) hypertension: Secondary | ICD-10-CM | POA: Insufficient documentation

## 2014-05-02 DIAGNOSIS — I200 Unstable angina: Secondary | ICD-10-CM | POA: Insufficient documentation

## 2014-05-02 DIAGNOSIS — M129 Arthropathy, unspecified: Secondary | ICD-10-CM | POA: Insufficient documentation

## 2014-05-02 DIAGNOSIS — R011 Cardiac murmur, unspecified: Secondary | ICD-10-CM | POA: Insufficient documentation

## 2014-05-02 DIAGNOSIS — Z8659 Personal history of other mental and behavioral disorders: Secondary | ICD-10-CM | POA: Insufficient documentation

## 2014-05-02 DIAGNOSIS — Z9889 Other specified postprocedural states: Secondary | ICD-10-CM | POA: Insufficient documentation

## 2014-05-02 DIAGNOSIS — I4891 Unspecified atrial fibrillation: Secondary | ICD-10-CM | POA: Insufficient documentation

## 2014-05-02 DIAGNOSIS — Z87891 Personal history of nicotine dependence: Secondary | ICD-10-CM | POA: Insufficient documentation

## 2014-05-02 DIAGNOSIS — Z8719 Personal history of other diseases of the digestive system: Secondary | ICD-10-CM | POA: Insufficient documentation

## 2014-05-02 DIAGNOSIS — Z7982 Long term (current) use of aspirin: Secondary | ICD-10-CM | POA: Insufficient documentation

## 2014-05-02 DIAGNOSIS — J441 Chronic obstructive pulmonary disease with (acute) exacerbation: Secondary | ICD-10-CM | POA: Insufficient documentation

## 2014-05-02 DIAGNOSIS — I2510 Atherosclerotic heart disease of native coronary artery without angina pectoris: Secondary | ICD-10-CM | POA: Insufficient documentation

## 2014-05-02 DIAGNOSIS — I509 Heart failure, unspecified: Secondary | ICD-10-CM | POA: Insufficient documentation

## 2014-05-02 LAB — CBC
HCT: 37.9 % — ABNORMAL LOW (ref 39.0–52.0)
Hemoglobin: 12.4 g/dL — ABNORMAL LOW (ref 13.0–17.0)
MCH: 30.3 pg (ref 26.0–34.0)
MCHC: 32.7 g/dL (ref 30.0–36.0)
MCV: 92.7 fL (ref 78.0–100.0)
PLATELETS: 244 10*3/uL (ref 150–400)
RBC: 4.09 MIL/uL — ABNORMAL LOW (ref 4.22–5.81)
RDW: 13.3 % (ref 11.5–15.5)
WBC: 6.7 10*3/uL (ref 4.0–10.5)

## 2014-05-02 LAB — BASIC METABOLIC PANEL
BUN: 25 mg/dL — ABNORMAL HIGH (ref 6–23)
CALCIUM: 9.1 mg/dL (ref 8.4–10.5)
CO2: 28 mEq/L (ref 19–32)
CREATININE: 1.62 mg/dL — AB (ref 0.50–1.35)
Chloride: 101 mEq/L (ref 96–112)
GFR, EST AFRICAN AMERICAN: 46 mL/min — AB (ref >90)
GFR, EST NON AFRICAN AMERICAN: 39 mL/min — AB (ref >90)
Glucose, Bld: 118 mg/dL — ABNORMAL HIGH (ref 70–99)
Potassium: 4.3 mEq/L (ref 3.7–5.3)
Sodium: 140 mEq/L (ref 137–147)

## 2014-05-02 LAB — I-STAT TROPONIN, ED: TROPONIN I, POC: 0.01 ng/mL (ref 0.00–0.08)

## 2014-05-02 NOTE — ED Notes (Signed)
Patient still highly agitated, and leaving AMA. Dr. Lavella LemonsManly nofitied.  Patient signed AMA.

## 2014-05-02 NOTE — ED Notes (Signed)
Multiple attempts by 2 RN's to start IV.

## 2014-05-02 NOTE — ED Provider Notes (Signed)
CSN: [id number]     Arrival date & time 05/02/14  0248 History   First MD Initiated Contact with Patient 05/02/14 0423     Chief Complaint  Patient presents with  . Chest Pain     (Consider location/radiation/quality/duration/timing/severity/associated sxs/prior Treatment) HPI This is a 79 year old man with history of coronary artery disease status status post CABG, status post aortic valve replacement-remotely.  The patient presents after experience seeing a vague feeling of pressure throughout his chest which began about 90 minutes prior to his arrival in the emergency department. Time he arrived in the emergency department, he was symptom-free. He notes chronic shortness of breath with exertion. This is unchanged. He has not had any cough or fever.  The patient says he has not experienced similar discomfort in the past. He describes his discomfort at 6/10 maximum severity. Nothing seemed to make discomfort worse or better. The patient says that he has been pain free throughout his emergency department stay. He reports compliance with medications. He has not seen a cardiologist in several years.  The patient does not know when he last underwent cardiac stress test. Chart view shows that he had cardiac catheterization last in 2011.  Past Medical History  Diagnosis Date  . Hypertension   . COPD (chronic obstructive pulmonary disease)     chronic  . Depression   . Fatigue   . Arthritis   . Hernia NEC     [place]  . Reflux   . Heart murmur   . Scarlet fever   . Dizziness   . CHF (congestive heart failure)     most recent EF 50-55%  . Coronary artery disease   . Arrhythmia     chronic atrial fibrillation  . Hyperlipidemia    Past Surgical History  Procedure Laterality Date  . Coronary artery bypass graft  2006    [place]  . Aortic valve replacement  2006    33-mm pericardial Edward's valve  . Cardiac catheterization  09/13/2010    patent grafts. no significant pulmonary  hypertension.    Family History  Problem Relation Age of Onset  . Cancer Mother   . Heart attack Father   . Heart attack Brother   . Cancer Brother    History  Substance Use Topics  . Smoking status: Former Smoker    Quit date: 12/17/1980  . Smokeless tobacco: Not on file  . Alcohol Use: No    Review of Systems Ten point review of symptoms performed and is negative with the exception of symptoms noted above. The patient wishes it to be noted that he has severely irritated and angry at the length of stay in the emergency department prior to physician evaluation.    Allergies  Review of patient's allergies indicates no known allergies.  Home Medications   Prior to Admission medications   Medication Sig Start Date End Date Taking? Authorizing Provider  aspirin 81 MG tablet Take 81 mg by mouth daily.     Yes Historical Provider, MD  furosemide (LASIX) 40 MG tablet Take 40 mg by mouth 2 (two) times daily.    Yes Historical Provider, MD  losartan (COZAAR) 50 MG tablet Take 1 tablet (50 mg total) by mouth daily. 03/30/13  Yes Vesta MixerPhilip J Nahser, MD  Omega-3 Fatty Acids (FISH OIL) 1200 MG CAPS Take 1,200 mg by mouth daily.    Yes Historical Provider, MD  potassium chloride (KLOR-CON) 10 MEQ CR tablet Take 20 mEq by mouth daily.  Yes Historical Provider, MD  warfarin (COUMADIN) 3 MG tablet Take 3-4.5 mg by mouth every morning. Pt takes 1.5 tabs on Monday, Wednesday and Friday.  All other days take 1 whole tablet   Yes Historical Provider, MD   BP 144/67  Pulse 89  Temp(Src) 97.8 F (36.6 C) (Oral)  Resp 15  Ht 5\' 8"  (1.727 m)  Wt 230 lb (104.327 kg)  BMI 34.98 kg/m2  SpO2 97% Physical Exam Gen: well developed and well nourished appearing Head: NCAT Eyes: PERL, EOMI Nose: no epistaixis or rhinorrhea Mouth/throat: mucosa is moist and pink Neck: supple, no stridor Lungs: CTA B, no wheezing, rhonchi or rales CV: RRR, no murmur, extremities appear well perfused.  Abd: soft,  notender, nondistended Back: no ttp, no cva ttp Skin: warm and dry Ext: normal to inspection, no dependent edema Neuro: CN ii-xii grossly intact, no focal deficits Psyche; normal affect,  calm and cooperative.  ED Course  Procedures (including critical care time) Labs Review  Results for orders placed during the hospital encounter of 05/02/14 (from the past 24 hour(s))  CBC     Status: Abnormal   Collection Time    05/02/14  3:15 AM      Result Value Ref Range   WBC 6.7  4.0 - 10.5 K/uL   RBC 4.09 (*) 4.22 - 5.81 MIL/uL   Hemoglobin 12.4 (*) 13.0 - 17.0 g/dL   HCT 57.837.9 (*) 46.939.0 - 62.952.0 %   MCV 92.7  78.0 - 100.0 fL   MCH 30.3  26.0 - 34.0 pg   MCHC 32.7  30.0 - 36.0 g/dL   RDW 52.813.3  41.311.5 - 24.415.5 %   Platelets 244  150 - 400 K/uL  BASIC METABOLIC PANEL     Status: Abnormal   Collection Time    05/02/14  3:15 AM      Result Value Ref Range   Sodium 140  137 - 147 mEq/L   Potassium 4.3  3.7 - 5.3 mEq/L   Chloride 101  96 - 112 mEq/L   CO2 28  19 - 32 mEq/L   Glucose, Bld 118 (*) 70 - 99 mg/dL   BUN 25 (*) 6 - 23 mg/dL   Creatinine, Ser 0.101.62 (*) 0.50 - 1.35 mg/dL   Calcium 9.1  8.4 - 27.210.5 mg/dL   GFR calc non Af Amer 39 (*) >90 mL/min   GFR calc Af Amer 46 (*) >90 mL/min  I-STAT TROPOININ, ED     Status: None   Collection Time    05/02/14  3:24 AM      Result Value Ref Range   Troponin i, poc 0.01  0.00 - 0.08 ng/mL   Comment 3               Imaging Review Dg Chest 2 View  05/02/2014   CLINICAL DATA:  Left-sided chest pain.  EXAM: CHEST  2 VIEW  COMPARISON:  DG CHEST 2 VIEW dated 05/25/2013  FINDINGS: The cardiac silhouette remains moderately enlarged, status post median sternotomy for cardiac valve replacement. Calcified aortic knob, mediastinal silhouette is nonsuspicious. Increased lung volumes with mild chronic interstitial changes and flattening of the hemidiaphragms. Nodule projecting right lung base likely reflects a nipple shadow, unchanged. No pneumothorax.  Mild  degenerative change of thoracic spine. Soft tissue planes are nonsuspicious.  IMPRESSION: Stable appearance of chest: Cardiomegaly and COPD without superimposed acute pulmonary process.   Electronically Signed   By: Awilda Metroourtnay  Bloomer   On: 05/02/2014  04:38   EKG: AF with normal rate, normal axis, occasional ectopic beat, normal QRS, wide QT, no acute ischemic changes identified.   MDM   DDX: ACS, pneumothorax, pneumonia, pericardial or pleural effusion, gastritis, GERD/PUD, musculoskeletal pain.   ED workup is nondiagnostic with negative first troponin, no acute ischemic changes on EKG, unremarkable chest x-ray. The patient maintains pain-free. I have recommended to the patient that he be admitted to the hospital for further evaluation, serial cardiac markers and Cardiology consultation.  I had to step out of the patient's room to attend to an unstable patient. When I returned about 15 minutes later, he had removed monitor leads and BP cuff, had gotten dressed and was demanding to leave. York Spaniel he was tired of waiting.  The patient has been counseled re: the possible consequences - including death - of failure to diagnose and treat ACS. He voices understanding. He is competent to decline care and will be discharged, per his wishes, against my medical advice.       Brandt Loosen, MD 05/02/14 [phone]

## 2014-05-02 NOTE — ED Notes (Signed)
Patient is highly agitated and ready to leave AMA.  X-ray arrived.  Offered the patient to travel to X-ray so that testing will be completed by the time a provider can see him.  Patient decides to travel to X-ray.

## 2014-05-02 NOTE — Discharge Instructions (Signed)
Acute Coronary Syndrome °Acute coronary syndrome (ACS) is an urgent problem in which the blood and oxygen supply to the heart is critically deficient. ACS requires hospitalization because one or more coronary arteries may be blocked. °ACS represents a range of conditions including: °· Previous angina that is now unstable, lasts longer, happens at rest, or is more intense. °· A heart attack, with heart muscle cell injury and death. °There are three vital coronary arteries that supply the heart muscle with blood and oxygen so that it can pump blood effectively. If blockages to these arteries develop, blood flow to the heart muscle is reduced. If the heart does not get enough blood, angina may occur as the first warning sign. °SYMPTOMS  °· The most common signs of angina include: °· Tightness or squeezing in the chest. °· Feeling of heaviness on the chest. °· Discomfort in the arms, neck, or jaw. °· Shortness of breath and nausea. °· Cold, wet skin. °· Angina is usually brought on by physical effort or excitement which increase the oxygen needs of the heart. These states increase the blood flow needs of the heart beyond what can be delivered. °TREATMENT  °· Medicines to help discomfort may include nitroglycerin (nitro) in the form of tablets or a spray for rapid relief, or longer-acting forms such as cream, patches, or capsules. (Be aware that there are many side effects and possible interactions with other drugs). °· Other medicines may be used to help the heart pump better. °· Procedures to open blocked arteries including angioplasty or stent placement to keep the arteries open. °· Open heart surgery may be needed when there are many blockages or they are in critical locations that are best treated with surgery. °HOME CARE INSTRUCTIONS  °· Avoid smoking. °· Take one baby or adult aspirin daily, if your caregiver advises. This helps reduce the risk of a heart attack. °· It is very important that you follow the angina  treatment prescribed by your caregiver. Make arrangements for proper follow-up care. °· Eat a heart healthy diet with salt and fat restrictions as advised. °· Regular exercise is good for you as long as it does not cause discomfort. Do not begin any new type of exercise until you check with your caregiver. °· If you are overweight, you should lose weight. °· Try to maintain normal blood lipid levels. °· Keep your blood pressure under control as recommended by your caregiver. °· You should tell your caregiver right away about any increase in the severity or frequency of your chest discomfort or angina attacks. When you have angina, you should stop what you are doing and sit down. This may bring relief in 3 to 5 minutes. If your caregiver has prescribed nitro, take it as directed. °· If your caregiver has given you a follow-up appointment, it is very important to keep that appointment. Not keeping the appointment could result in a chronic or permanent injury, pain, and disability. If there is any problem keeping the appointment, you must call back to this facility for assistance. °SEEK IMMEDIATE MEDICAL CARE IF:  °· You develop nausea, vomiting, or shortness of breath. °· You feel faint, lightheaded, or pass out. °· Your chest discomfort gets worse. °· You are sweating or experience sudden profound fatigue. °· You do not get relief of your chest pain after 3 doses of nitro. °· Your discomfort lasts longer than 15 minutes. °MAKE SURE YOU:  °· Understand these instructions. °· Will watch your condition. °· Will get help   right away if you are not doing well or get worse. °Document Released: 12/03/2005 Document Revised: 02/25/2012 Document Reviewed: 07/06/2008 °[employer]® Patient Information ©2014 [employer]. ° °

## 2014-05-02 NOTE — ED Notes (Signed)
IV team paged.  

## 2014-05-02 NOTE — ED Notes (Signed)
Pt c/o non radiating left sided chest pain. Pt was awoken from sleep during on set of pain. Pt denies any associated symptoms. Hx of open heart surgery. Rates pain 3/10

## 2014-06-21 ENCOUNTER — Telehealth: Payer: Self-pay | Admitting: Cardiovascular Disease

## 2014-06-21 NOTE — Telephone Encounter (Signed)
New message   [employer] calling    1. Most recent EJ fraction results   2. Will Dr. Elease HashimotoNahser be managing   his heart failure   3. Does patient have heart failure as diagnoses.

## 2014-06-21 NOTE — Telephone Encounter (Signed)
Patient is due for f/u and past due for echocardiogram (appointment was cancelled by patient). Left message for Velna HatchetSheila to call me regarding information requested.

## 2014-10-14 ENCOUNTER — Ambulatory Visit: Payer: Medicare Other | Admitting: Cardiovascular Disease

## 2016-01-17 DIAGNOSIS — I10 Essential (primary) hypertension: Secondary | ICD-10-CM | POA: Diagnosis not present

## 2016-01-17 DIAGNOSIS — Z7901 Long term (current) use of anticoagulants: Secondary | ICD-10-CM | POA: Diagnosis not present

## 2016-01-17 DIAGNOSIS — F0390 Unspecified dementia without behavioral disturbance: Secondary | ICD-10-CM | POA: Diagnosis not present

## 2016-01-17 DIAGNOSIS — R7309 Other abnormal glucose: Secondary | ICD-10-CM | POA: Diagnosis not present

## 2016-02-15 DIAGNOSIS — F0390 Unspecified dementia without behavioral disturbance: Secondary | ICD-10-CM | POA: Diagnosis not present

## 2016-02-15 DIAGNOSIS — I10 Essential (primary) hypertension: Secondary | ICD-10-CM | POA: Diagnosis not present

## 2016-02-15 DIAGNOSIS — Z7901 Long term (current) use of anticoagulants: Secondary | ICD-10-CM | POA: Diagnosis not present

## 2016-02-16 DIAGNOSIS — E785 Hyperlipidemia, unspecified: Secondary | ICD-10-CM | POA: Diagnosis not present

## 2016-02-16 DIAGNOSIS — Z7901 Long term (current) use of anticoagulants: Secondary | ICD-10-CM | POA: Diagnosis not present

## 2016-02-16 DIAGNOSIS — I10 Essential (primary) hypertension: Secondary | ICD-10-CM | POA: Diagnosis not present

## 2017-08-20 ENCOUNTER — Ambulatory Visit: Payer: Self-pay | Admitting: Cardiovascular Disease

## 2021-02-01 DIAGNOSIS — I361 Nonrheumatic tricuspid (valve) insufficiency: Secondary | ICD-10-CM | POA: Diagnosis not present

## 2021-02-01 DIAGNOSIS — I351 Nonrheumatic aortic (valve) insufficiency: Secondary | ICD-10-CM

## 2021-02-01 DIAGNOSIS — I340 Nonrheumatic mitral (valve) insufficiency: Secondary | ICD-10-CM | POA: Diagnosis not present

## 2021-02-27 DIAGNOSIS — J449 Chronic obstructive pulmonary disease, unspecified: Secondary | ICD-10-CM | POA: Insufficient documentation

## 2021-02-27 DIAGNOSIS — I10 Essential (primary) hypertension: Secondary | ICD-10-CM | POA: Insufficient documentation

## 2021-02-27 DIAGNOSIS — R011 Cardiac murmur, unspecified: Secondary | ICD-10-CM | POA: Insufficient documentation

## 2021-02-27 DIAGNOSIS — M1990 Unspecified osteoarthritis, unspecified site: Secondary | ICD-10-CM | POA: Insufficient documentation

## 2021-02-27 DIAGNOSIS — F32A Depression, unspecified: Secondary | ICD-10-CM | POA: Insufficient documentation

## 2021-02-27 DIAGNOSIS — I499 Cardiac arrhythmia, unspecified: Secondary | ICD-10-CM | POA: Insufficient documentation

## 2021-02-27 DIAGNOSIS — A389 Scarlet fever, uncomplicated: Secondary | ICD-10-CM | POA: Insufficient documentation

## 2021-02-27 DIAGNOSIS — R5383 Other fatigue: Secondary | ICD-10-CM | POA: Insufficient documentation

## 2021-02-27 DIAGNOSIS — R42 Dizziness and giddiness: Secondary | ICD-10-CM | POA: Insufficient documentation

## 2021-03-02 ENCOUNTER — Other Ambulatory Visit: Payer: Self-pay

## 2021-03-02 ENCOUNTER — Ambulatory Visit: Payer: Medicare Other | Admitting: Cardiology

## 2021-03-02 ENCOUNTER — Encounter: Payer: Self-pay | Admitting: Cardiology

## 2021-03-02 VITALS — BP 94/46 | HR 47 | Ht 70.0 in | Wt 220.0 lb

## 2021-03-02 DIAGNOSIS — I10 Essential (primary) hypertension: Secondary | ICD-10-CM | POA: Diagnosis not present

## 2021-03-02 DIAGNOSIS — I4821 Permanent atrial fibrillation: Secondary | ICD-10-CM

## 2021-03-02 DIAGNOSIS — Z952 Presence of prosthetic heart valve: Secondary | ICD-10-CM | POA: Insufficient documentation

## 2021-03-02 DIAGNOSIS — R931 Abnormal findings on diagnostic imaging of heart and coronary circulation: Secondary | ICD-10-CM

## 2021-03-02 DIAGNOSIS — I2510 Atherosclerotic heart disease of native coronary artery without angina pectoris: Secondary | ICD-10-CM | POA: Diagnosis not present

## 2021-03-02 DIAGNOSIS — E782 Mixed hyperlipidemia: Secondary | ICD-10-CM | POA: Insufficient documentation

## 2021-03-02 DIAGNOSIS — I361 Nonrheumatic tricuspid (valve) insufficiency: Secondary | ICD-10-CM | POA: Insufficient documentation

## 2021-03-02 DIAGNOSIS — R0989 Other specified symptoms and signs involving the circulatory and respiratory systems: Secondary | ICD-10-CM

## 2021-03-02 DIAGNOSIS — I340 Nonrheumatic mitral (valve) insufficiency: Secondary | ICD-10-CM

## 2021-03-02 DIAGNOSIS — I272 Pulmonary hypertension, unspecified: Secondary | ICD-10-CM

## 2021-03-02 MED ORDER — LOSARTAN POTASSIUM 25 MG PO TABS: 25.0000 mg | ORAL_TABLET | Freq: Every day | ORAL | 1 refills | Status: DC

## 2021-03-02 MED ORDER — FUROSEMIDE 40 MG PO TABS: 40.0000 mg | ORAL_TABLET | Freq: Every day | ORAL | 1 refills | Status: DC

## 2021-03-02 NOTE — Patient Instructions (Addendum)
Medication Instructions:  Your physician has recommended you make the following change in your medication:   START: Losartan 25 mg daily  START: Lasix 40 mg daily   *If you need a refill on your cardiac medications before your next appointment, please call your pharmacy*   Lab Work: Bmp, mg, cbc   If you have labs (blood work) drawn today and your tests are completely normal, you will receive your results only by: Marland Kitchen MyChart Message (if you have MyChart) OR . A paper copy in the mail If you have any lab test that is abnormal or we need to change your treatment, we will call you to review the results.   Testing/Procedures: none   Follow-Up: At [facility], you and your health needs are our priority.  As part of our continuing mission to provide you with exceptional heart care, we have created designated Provider Care Teams.  These Care Teams include your primary Cardiologist (physician) and Advanced Practice Providers (APPs -  Physician Assistants and Nurse Practitioners) who all work together to provide you with the care you need, when you need it.  We recommend signing up for the patient portal called "MyChart".  Sign up information is provided on this After Visit Summary.  MyChart is used to connect with patients for Virtual Visits (Telemedicine).  Patients are able to view lab/test results, encounter notes, upcoming appointments, etc.  Non-urgent messages can be sent to your provider as well.   To learn more about what you can do with MyChart, go to [email or web address].    Your next appointment:   6 month(s)  The format for your next appointment:   In Person  Provider:   Dr. Servando Salina    Other Instructions

## 2021-03-02 NOTE — Progress Notes (Signed)
Cardiology Office Note:    Date:  03/02/2021   ID:  ASAPH SERENA, DOB October 13, 1935, MRN [id number]  PCP:  Dema Severin, NP  Cardiologist:  Thomasene Ripple, DO  Electrophysiologist:  None   Referring MD: Dema Severin, NP   Chief Complaint  Patient presents with  . Echo results per PCP    History of Present Illness:    [name] [name] is a 86 y.o. male with a hx of chronic atrial fibrillation which appears to be have diagnosed back in 2010 the patient was on warfarin but was recently stopped based on concern from the patient family members and his PCP for falling he is off beta-blocker due to occasional pulses while on beta-blocker, coronary artery disease status post coronary artery bypass grafting in 2006 with LIMA to LAD, SVG to RCA, sequential SVG to obtuse marginal 1 and 2, aortic valve replacement in 2000, in 2011 echocardiogram show EF of 50 to 55%, hypertension and hyperlipidemia. Patient used to follow-up with cardiology but then stopped at his last follow-up in 2014 and had not seen cardiology since that time.  He is here today with his daughter because he had a recent hospitalization which showed that his ejection fraction had decreased to 30 to 35%.  He also did have evidence of mild to moderate tricuspid regurgitation, mild regurgitation as well.  Today he offers no complaints.  He denies any chest pain, any shortness of breath.  Past Medical History:  Diagnosis Date  . Arrhythmia    chronic atrial fibrillation  . Arthritis   . Atrial fibrillation (HCC)    chronic atrial fibrillation   . CHF (congestive heart failure) (HCC)    most recent EF 50-55%  . COPD (chronic obstructive pulmonary disease) (HCC)    chronic  . Coronary artery disease   . Depression   . Dizziness   . Fatigue   . Heart murmur   . Hernia NEC    hiatel  . Hyperlipidemia   . Hypertension   . Reflux   . Scarlet fever     Past Surgical History:  Procedure Laterality Date  . AORTIC VALVE  REPLACEMENT  2006   33-mm pericardial Edward's valve  . CARDIAC CATHETERIZATION  09/13/2010   patent grafts. no significant pulmonary hypertension.   . CORONARY ARTERY BYPASS GRAFT  2006   [place]    Current Medications: Current Meds  Medication Sig  . albuterol (PROVENTIL) (2.5 MG/3ML) 0.083% nebulizer solution Inhale 2.5 mLs into the lungs every 6 (six) hours as needed for wheezing.  Marland Kitchen aspirin 81 MG tablet Take 81 mg by mouth daily.  . potassium chloride (KLOR-CON) 10 MEQ CR tablet Take 20 mEq by mouth daily.  . TRELEGY ELLIPTA 100-62.5-25 MCG/INH AEPB Inhale 1 puff into the lungs daily.  . [DISCONTINUED] furosemide (LASIX) 40 MG tablet Take 40 mg by mouth daily.  . [DISCONTINUED] losartan (COZAAR) 50 MG tablet Take 1 tablet (50 mg total) by mouth daily.     Allergies:   Patient has no known allergies.   Social History   Socioeconomic History  . Marital status: Divorced    Spouse name: Not on file  . Number of children: Not on file  . Years of education: Not on file  . Highest education level: Not on file  Occupational History  . Not on file  Tobacco Use  . Smoking status: Former Smoker    Quit date: 12/17/1980    Years since quitting: 40.2  .  Smokeless tobacco: Never Used  Substance and Sexual Activity  . Alcohol use: No  . Drug use: No  . Sexual activity: Not Currently  Other Topics Concern  . Not on file  Social History Narrative  . Not on file   Social Determinants of Health   Financial Resource Strain: Not on file  Food Insecurity: Not on file  Transportation Needs: Not on file  Physical Activity: Not on file  Stress: Not on file  Social Connections: Not on file     Family History: The patient's family history includes Cancer in his brother and mother; Heart attack in his brother and father.  ROS:   Review of Systems  Constitution: Negative for decreased appetite, fever and weight gain.  HENT: Negative for congestion, ear discharge, hoarse voice and  sore throat.   Eyes: Negative for discharge, redness, vision loss in right eye and visual halos.  Cardiovascular: Negative for chest pain, dyspnea on exertion, leg swelling, orthopnea and palpitations.  Respiratory: Negative for cough, hemoptysis, shortness of breath and snoring.   Endocrine: Negative for heat intolerance and polyphagia.  Hematologic/Lymphatic: Negative for bleeding problem. Does not bruise/bleed easily.  Skin: Negative for flushing, nail changes, rash and suspicious lesions.  Musculoskeletal: Negative for arthritis, joint pain, muscle cramps, myalgias, neck pain and stiffness.  Gastrointestinal: Negative for abdominal pain, bowel incontinence, diarrhea and excessive appetite.  Genitourinary: Negative for decreased libido, genital sores and incomplete emptying.  Neurological: Negative for brief paralysis, focal weakness, headaches and loss of balance.  Psychiatric/Behavioral: Negative for altered mental status, depression and suicidal ideas.  Allergic/Immunologic: Negative for HIV exposure and persistent infections.    EKGs/Labs/Other Studies Reviewed:    The following studies were reviewed today:   EKG:  The ekg ordered today demonstrates atrial fibrillation with controlled ventricular rate, heart rate 73 bpm.  His echocardiogram which was done on February 01, 2021 ordered by his PCP showed left ventricular ejection fraction moderately to severely impaired 30 to 35%, wall motion abnormalities with basal inferior, mid lateral, mid inferior wall akinesis with apical lateral hypokinesis.  Left atrium was severely dilated, right atrium is mild to moderately enlarged.  There is mild aortic regurgitation.  Mild mitral regurgitation.  Mild to moderate tricuspid regurgitation.  Right ventricular systolic function is slightly abnormal with RVSP 40 mmHg.  His bioprosthetic aortic valve was well-seated.  Recent Labs: No results found for requested labs within last 8760 hours.  Recent  Lipid Panel    Component Value Date/Time   CHOL 227 (H) 09/19/2011 0110   TRIG 119 09/19/2011 0110   HDL 37 (L) 09/19/2011 0110   CHOLHDL 6.1 09/19/2011 0110   VLDL 24 09/19/2011 0110   LDLCALC 166 (H) 09/19/2011 0110    Physical Exam:    VS:  BP (!) 94/46 (BP Location: Left Arm, Patient Position: Sitting)   Pulse (!) 47   Ht 5\' 10"  (1.778 m)   Wt 220 lb (99.8 kg)   SpO2 99%   BMI 31.57 kg/m     Wt Readings from Last 3 Encounters:  03/02/21 220 lb (99.8 kg)  05/02/14 230 lb (104.3 kg)  06/15/13 219 lb 1.9 oz (99.4 kg)     GEN: Well nourished, well developed in no acute distress HEENT: Normal NECK: No JVD; No carotid bruits LYMPHATICS: No lymphadenopathy CARDIAC: S1S2 noted,RRR, no murmurs, rubs, gallops RESPIRATORY:  Clear to auscultation without rales, wheezing or rhonchi  ABDOMEN: Soft, non-tender, non-distended, +bowel sounds, no guarding. EXTREMITIES: No edema, No cyanosis,  no clubbing MUSCULOSKELETAL:  No deformity  SKIN: Warm and dry NEUROLOGIC:  Alert and oriented x 3, non-focal PSYCHIATRIC:  Normal affect, good insight  ASSESSMENT:    1. Coronary artery disease involving native coronary artery of native heart without angina pectoris   2. Permanent atrial fibrillation (HCC)   3. History of aortic valve replacement   4. Primary hypertension   5. Mixed hyperlipidemia   6. Nonrheumatic tricuspid valve regurgitation   7. Nonrheumatic mitral valve regurgitation   8. Pulmonary hypertension, unspecified (HCC)   9. Depressed left ventricular ejection fraction    PLAN:     His recent echo show evidence of depressed ejection fraction with a EF of 30 to 35%.  His last note in 2014 by cardiology reports that his EF at that time was 50 to 55% .  He has been placed on losartan 50 mg daily but today he is hypotensive someone to cut back on the losartan to 25 mg daily.  And cut back on his Lasix to 40 mg daily.  I am hoping that we can be able to optimize his medical  therapy to help with his depressed ejection fraction.  One problem though is back in 2014 he was noted to have 2 to 3-second pauses while on beta-blocker so we will have to monitor the patient closely for this.  He does not have any angina symptoms he has had history of coronary artery bypass graft he is on aspirin.  Treated this medication.  He is on permanent atrial fibrillation left atrial size is severely dilated.  He has had A. fib since 2010.  He was previously on warfarin.  But the patient and his daughter tells me that this medication has been stopped based on their preference.  At this time the prefer to have the patient stay on aspirin.  Explained to them that aspirin really would not cover A. fib related stroke.    He has pulmonary hypertension with his pulmonary artery systolic pressure is 40 mmHg.  This is likely contribution from group 2 and group 3.  We will continue to monitor.  No shortness of breath at this time.  In terms of his valvular disease with tricuspid regurgitation, atrial regurgitation and he has had aortic valve replacement we will continue to monitor.  Cautious diuretics as needed.  The patient is in agreement with the above plan. The patient left the office in stable condition.  The patient will follow up in 6 weeks due to medication change.   Medication Adjustments/Labs and Tests Ordered: Current medicines are reviewed at length with the patient today.  Concerns regarding medicines are outlined above.  Orders Placed This Encounter  Procedures  . Basic metabolic panel  . Magnesium  . CBC  . EKG 12-Lead   Meds ordered this encounter  Medications  . losartan (COZAAR) 25 MG tablet    Sig: Take 1 tablet (25 mg total) by mouth daily.    Dispense:  90 tablet    Refill:  1  . furosemide (LASIX) 40 MG tablet    Sig: Take 1 tablet (40 mg total) by mouth daily.    Dispense:  90 tablet    Refill:  1    Patient Instructions  Medication Instructions:  Your  physician has recommended you make the following change in your medication:   START: Losartan 25 mg daily  START: Lasix 40 mg daily   *If you need a refill on your cardiac medications before your next  appointment, please call your pharmacy*   Lab Work: Bmp, mg, cbc   If you have labs (blood work) drawn today and your tests are completely normal, you will receive your results only by: Marland Kitchen MyChart Message (if you have MyChart) OR . A paper copy in the mail If you have any lab test that is abnormal or we need to change your treatment, we will call you to review the results.   Testing/Procedures: none   Follow-Up: At [facility], you and your health needs are our priority.  As part of our continuing mission to provide you with exceptional heart care, we have created designated Provider Care Teams.  These Care Teams include your primary Cardiologist (physician) and Advanced Practice Providers (APPs -  Physician Assistants and Nurse Practitioners) who all work together to provide you with the care you need, when you need it.  We recommend signing up for the patient portal called "MyChart".  Sign up information is provided on this After Visit Summary.  MyChart is used to connect with patients for Virtual Visits (Telemedicine).  Patients are able to view lab/test results, encounter notes, upcoming appointments, etc.  Non-urgent messages can be sent to your provider as well.   To learn more about what you can do with MyChart, go to [email or web address].    Your next appointment:   6 month(s)  The format for your next appointment:   In Person  Provider:   Dr. Servando Salina    Other Instructions      Adopting a Healthy Lifestyle.  Know what a healthy weight is for you (roughly BMI <25) and aim to maintain this   Aim for 7+ servings of fruits and vegetables daily   65-80+ fluid ounces of water or unsweet tea for healthy kidneys   Limit to max 1 drink of alcohol per day; avoid  smoking/tobacco   Limit animal fats in diet for cholesterol and heart health - choose grass fed whenever available   Avoid highly processed foods, and foods high in saturated/trans fats   Aim for low stress - take time to unwind and care for your mental health   Aim for 150 min of moderate intensity exercise weekly for heart health, and weights twice weekly for bone health   Aim for 7-9 hours of sleep daily   When it comes to diets, agreement about the perfect plan isnt easy to find, even among the experts. Experts at the [facility] of [employer] developed an idea known as the Healthy Eating Plate. Just imagine a plate divided into logical, healthy portions.   The emphasis is on diet quality:   Load up on vegetables and fruits - one-half of your plate: Aim for color and variety, and remember that potatoes dont count.   Go for whole grains - one-quarter of your plate: Whole wheat, barley, wheat berries, quinoa, oats, brown rice, and foods made with them. If you want pasta, go with whole wheat pasta.   Protein power - one-quarter of your plate: Fish, chicken, beans, and nuts are all healthy, versatile protein sources. Limit red meat.   The diet, however, does go beyond the plate, offering a few other suggestions.   Use healthy plant oils, such as olive, canola, soy, corn, sunflower and peanut. Check the labels, and avoid partially hydrogenated oil, which have unhealthy trans fats.   If youre thirsty, drink water. Coffee and tea are good in moderation, but skip sugary drinks and limit milk and dairy products to one  or two daily servings.   The type of carbohydrate in the diet is more important than the amount. Some sources of carbohydrates, such as vegetables, fruits, whole grains, and beans-are healthier than others.   Finally, stay active  Signed, Thomasene Ripple, DO  03/02/2021 1:07 PM    [facility]

## 2021-03-03 LAB — CBC
Hematocrit: 38.3 % (ref 37.5–51.0)
Hemoglobin: 12.4 g/dL — ABNORMAL LOW (ref 13.0–17.7)
MCH: 28.2 pg (ref 26.6–33.0)
MCHC: 32.4 g/dL (ref 31.5–35.7)
MCV: 87 fL (ref 79–97)
Platelets: 299 10*3/uL (ref 150–450)
RBC: 4.39 x10E6/uL (ref 4.14–5.80)
RDW: 13.5 % (ref 11.6–15.4)
WBC: 9.3 10*3/uL (ref 3.4–10.8)

## 2021-03-03 LAB — BASIC METABOLIC PANEL
BUN/Creatinine Ratio: 9 — ABNORMAL LOW (ref 10–24)
BUN: 16 mg/dL (ref 8–27)
CO2: 24 mmol/L (ref 20–29)
Calcium: 9.4 mg/dL (ref 8.6–10.2)
Chloride: 96 mmol/L (ref 96–106)
Creatinine, Ser: 1.79 mg/dL — ABNORMAL HIGH (ref 0.76–1.27)
Glucose: 87 mg/dL (ref 65–99)
Potassium: 4.7 mmol/L (ref 3.5–5.2)
Sodium: 137 mmol/L (ref 134–144)
eGFR: 37 mL/min/{1.73_m2} — ABNORMAL LOW (ref >59)

## 2021-03-03 LAB — MAGNESIUM: Magnesium: 2.6 mg/dL — ABNORMAL HIGH (ref 1.6–2.3)

## 2021-03-06 ENCOUNTER — Telehealth: Payer: Self-pay

## 2021-03-06 NOTE — Telephone Encounter (Signed)
-----   Message from Kardie Tobb, DO sent at 03/03/2021  8:04 AM EDT ----- Creatinine slightly elevated compared to 6 years ago, but I think this is his new baseline. 

## 2021-03-06 NOTE — Telephone Encounter (Signed)
Left message on patients voicemail to please return our call.   

## 2021-03-07 ENCOUNTER — Telehealth: Payer: Self-pay

## 2021-03-07 NOTE — Telephone Encounter (Signed)
-----   Message from Thomasene Ripple, DO sent at 03/03/2021  8:04 AM EDT ----- Creatinine slightly elevated compared to 6 years ago, but I think this is his new baseline.

## 2021-03-07 NOTE — Telephone Encounter (Signed)
Tried calling patient. No answer and no voicemail set up for me to leave a message. 

## 2021-04-17 ENCOUNTER — Ambulatory Visit (INDEPENDENT_AMBULATORY_CARE_PROVIDER_SITE_OTHER): Payer: Medicare Other | Admitting: Cardiology

## 2021-04-17 ENCOUNTER — Other Ambulatory Visit: Payer: Self-pay

## 2021-04-17 ENCOUNTER — Encounter: Payer: Self-pay | Admitting: Cardiology

## 2021-04-17 VITALS — BP 118/62 | HR 66 | Ht 70.0 in | Wt 229.4 lb

## 2021-04-17 DIAGNOSIS — E669 Obesity, unspecified: Secondary | ICD-10-CM

## 2021-04-17 DIAGNOSIS — I4821 Permanent atrial fibrillation: Secondary | ICD-10-CM

## 2021-04-17 DIAGNOSIS — E782 Mixed hyperlipidemia: Secondary | ICD-10-CM | POA: Diagnosis not present

## 2021-04-17 DIAGNOSIS — I10 Essential (primary) hypertension: Secondary | ICD-10-CM | POA: Diagnosis not present

## 2021-04-17 DIAGNOSIS — I2510 Atherosclerotic heart disease of native coronary artery without angina pectoris: Secondary | ICD-10-CM | POA: Diagnosis not present

## 2021-04-17 NOTE — Progress Notes (Signed)
Cardiology Office Note:    Date:  04/17/2021   ID:  [name] Rightmery W Diefendorf, DOB 12/14/1935, MRN [id number]  PCP:  Dema SeverinYork, Regina F, NP  Cardiologist:  Thomasene RippleKardie Tyheem Boughner, DO  Electrophysiologist:  None   Referring MD: Dema SeverinYork, Regina F, NP   I feel better  History of Present Illness:    [name] [name] is a 86 y.o. male with a hx of a hx of chronic atrial fibrillation which appears to be have diagnosed back in 2010 the patient was on warfarin but was recently stopped based on concern from the patient family members and his PCP for falling he is off beta-blocker due to occasional pulses while on beta-blocker, coronary artery disease status post coronary artery bypass grafting in 2006 with LIMA to LAD, SVG to RCA, sequential SVG to obtuse marginal 1 and 2, aortic valve replacement in 2000, in 2011 echocardiogram show EF of 50 to 55%, hypertension and hyperlipidemia. Patient used to follow-up with cardiology but then stopped at his last follow-up in 2014 and had not seen cardiology since that time.    I initially saw the patient on March 02, 2021 at that time he presented with his daughter because he had a recent hospitalization which showed that his ejection fraction had decreased to 30 to 35%.  He also did have evidence of mild to moderate tricuspid regurgitation, mild regurgitation as well.  Past Medical History:  Diagnosis Date  . Arrhythmia    chronic atrial fibrillation  . Arthritis   . Atrial fibrillation (HCC)    chronic atrial fibrillation   . CHF (congestive heart failure) (HCC)    most recent EF 50-55%  . COPD (chronic obstructive pulmonary disease) (HCC)    chronic  . Coronary artery disease   . Depression   . Dizziness   . Fatigue   . Heart murmur   . Hernia NEC    hiatel  . Hyperlipidemia   . Hypertension   . Reflux   . Scarlet fever     Past Surgical History:  Procedure Laterality Date  . AORTIC VALVE REPLACEMENT  2006   33-mm pericardial Edward's valve  . CARDIAC  CATHETERIZATION  09/13/2010   patent grafts. no significant pulmonary hypertension.   . CORONARY ARTERY BYPASS GRAFT  2006   [place]    Current Medications: Current Meds  Medication Sig  . albuterol (PROVENTIL) (2.5 MG/3ML) 0.083% nebulizer solution Inhale 2.5 mLs into the lungs every 6 (six) hours as needed for wheezing.  Marland Kitchen. aspirin 81 MG tablet Take 81 mg by mouth daily.  . furosemide (LASIX) 40 MG tablet Take 1 tablet (40 mg total) by mouth daily.  Marland Kitchen. losartan (COZAAR) 25 MG tablet Take 1 tablet (25 mg total) by mouth daily.  . potassium chloride (KLOR-CON) 10 MEQ CR tablet Take 20 mEq by mouth daily.  . TRELEGY ELLIPTA 100-62.5-25 MCG/INH AEPB Inhale 1 puff into the lungs daily.  . vitamin B-12 (CYANOCOBALAMIN) 1000 MCG tablet Take 1,000 mcg by mouth daily.     Allergies:   Patient has no known allergies.   Social History   Socioeconomic History  . Marital status: Divorced    Spouse name: Not on file  . Number of children: Not on file  . Years of education: Not on file  . Highest education level: Not on file  Occupational History  . Not on file  Tobacco Use  . Smoking status: Former Smoker    Quit date: 12/17/1980    Years since  quitting: 40.3  . Smokeless tobacco: Never Used  Substance and Sexual Activity  . Alcohol use: No  . Drug use: No  . Sexual activity: Not Currently  Other Topics Concern  . Not on file  Social History Narrative  . Not on file   Social Determinants of Health   Financial Resource Strain: Not on file  Food Insecurity: Not on file  Transportation Needs: Not on file  Physical Activity: Not on file  Stress: Not on file  Social Connections: Not on file     Family History: The patient's family history includes Cancer in his brother and mother; Heart attack in his brother and father.  ROS:   Review of Systems  Constitution: Negative for decreased appetite, fever and weight gain.  HENT: Negative for congestion, ear discharge, hoarse voice  and sore throat.   Eyes: Negative for discharge, redness, vision loss in right eye and visual halos.  Cardiovascular: Negative for chest pain, dyspnea on exertion, leg swelling, orthopnea and palpitations.  Respiratory: Negative for cough, hemoptysis, shortness of breath and snoring.   Endocrine: Negative for heat intolerance and polyphagia.  Hematologic/Lymphatic: Negative for bleeding problem. Does not bruise/bleed easily.  Skin: Negative for flushing, nail changes, rash and suspicious lesions.  Musculoskeletal: Negative for arthritis, joint pain, muscle cramps, myalgias, neck pain and stiffness.  Gastrointestinal: Negative for abdominal pain, bowel incontinence, diarrhea and excessive appetite.  Genitourinary: Negative for decreased libido, genital sores and incomplete emptying.  Neurological: Negative for brief paralysis, focal weakness, headaches and loss of balance.  Psychiatric/Behavioral: Negative for altered mental status, depression and suicidal ideas.  Allergic/Immunologic: Negative for HIV exposure and persistent infections.    EKGs/Labs/Other Studies Reviewed:    The following studies were reviewed today:   EKG:  None today  His echocardiogram which was done on February 01, 2021 ordered by his PCP showed left ventricular ejection fraction moderately to severely impaired 30 to 35%, wall motion abnormalities with basal inferior, mid lateral, mid inferior wall akinesis with apical lateral hypokinesis.  Left atrium was severely dilated, right atrium is mild to moderately enlarged.  There is mild aortic regurgitation.  Mild mitral regurgitation.  Mild to moderate tricuspid regurgitation.  Right ventricular systolic function is slightly abnormal with RVSP 40 mmHg.  His bioprosthetic aortic valve was well-seated.  Recent Labs: 03/02/2021: BUN 16; Creatinine, Ser 1.79; Hemoglobin 12.4; Magnesium 2.6; Platelets 299; Potassium 4.7; Sodium 137  Recent Lipid Panel    Component Value  Date/Time   CHOL 227 (H) 09/19/2011 0110   TRIG 119 09/19/2011 0110   HDL 37 (L) 09/19/2011 0110   CHOLHDL 6.1 09/19/2011 0110   VLDL 24 09/19/2011 0110   LDLCALC 166 (H) 09/19/2011 0110    Physical Exam:    VS:  BP 118/62   Pulse 66   Ht 5\' 10"  (1.778 m)   Wt 229 lb 6.4 oz (104.1 kg)   SpO2 96%   BMI 32.92 kg/m     Wt Readings from Last 3 Encounters:  04/17/21 229 lb 6.4 oz (104.1 kg)  03/02/21 220 lb (99.8 kg)  05/02/14 230 lb (104.3 kg)     GEN: Well nourished, well developed in no acute distress HEENT: Normal NECK: No JVD; No carotid bruits LYMPHATICS: No lymphadenopathy CARDIAC: S1S2 noted,RRR, no murmurs, rubs, gallops RESPIRATORY:  Clear to auscultation without rales, wheezing or rhonchi  ABDOMEN: Soft, non-tender, non-distended, +bowel sounds, no guarding. EXTREMITIES: No edema, No cyanosis, no clubbing MUSCULOSKELETAL:  No deformity  SKIN: Warm and  dry NEUROLOGIC:  Alert and oriented x 3, non-focal PSYCHIATRIC:  Normal affect, good insight  ASSESSMENT:    1. Coronary artery disease involving native coronary artery of native heart without angina pectoris   2. Permanent atrial fibrillation (HCC)   3. Hypertension, unspecified type   4. Mixed hyperlipidemia   5. Obesity (BMI 30-39.9)    PLAN:    He is stable from a coronary artery disease standpoint.  His blood pressure has improved since we cut back on the losartan to 25 mg daily.  Unfortunately he still is in the low 100s.  For his depressed ejection fraction he needs guideline directed medical therapy but it is very hard given his low blood pressure.  For now we will hold off on adding beta-blocker or Aldactone.  First I would like to transition the patient to [facility] if were able to maintain this blood pressure.  The patient understands the need to lose weight with diet and exercise. We have discussed specific strategies for this.  He has is euvolemic in the office today.  The patient is in agreement  with the above plan. The patient left the office in stable condition.  The patient will follow up in 6 months or sooner if needed.   Medication Adjustments/Labs and Tests Ordered: Current medicines are reviewed at length with the patient today.  Concerns regarding medicines are outlined above.  No orders of the defined types were placed in this encounter.  No orders of the defined types were placed in this encounter.   There are no Patient Instructions on file for this visit.   Adopting a Healthy Lifestyle.  Know what a healthy weight is for you (roughly BMI <25) and aim to maintain this   Aim for 7+ servings of fruits and vegetables daily   65-80+ fluid ounces of water or unsweet tea for healthy kidneys   Limit to max 1 drink of alcohol per day; avoid smoking/tobacco   Limit animal fats in diet for cholesterol and heart health - choose grass fed whenever available   Avoid highly processed foods, and foods high in saturated/trans fats   Aim for low stress - take time to unwind and care for your mental health   Aim for 150 min of moderate intensity exercise weekly for heart health, and weights twice weekly for bone health   Aim for 7-9 hours of sleep daily   When it comes to diets, agreement about the perfect plan isnt easy to find, even among the experts. Experts at the [facility] [employer] developed an idea known as the Healthy Eating Plate. Just imagine a plate divided into logical, healthy portions.   The emphasis is on diet quality:   Load up on vegetables and fruits - one-half of your plate: Aim for color and variety, and remember that potatoes dont count.   Go for whole grains - one-quarter of your plate: Whole wheat, barley, wheat berries, quinoa, oats, brown rice, and foods made with them. If you want pasta, go with whole wheat pasta.   Protein power - one-quarter of your plate: Fish, chicken, beans, and nuts are all healthy, versatile protein sources.  Limit red meat.   The diet, however, does go beyond the plate, offering a few other suggestions.   Use healthy plant oils, such as olive, canola, soy, corn, sunflower and peanut. Check the labels, and avoid partially hydrogenated oil, which have unhealthy trans fats.   If youre thirsty, drink water. Coffee and tea  are good in moderation, but skip sugary drinks and limit milk and dairy products to one or two daily servings.   The type of carbohydrate in the diet is more important than the amount. Some sources of carbohydrates, such as vegetables, fruits, whole grains, and beans-are healthier than others.   Finally, stay active  Signed, Thomasene Ripple, DO  04/17/2021 9:16 AM    [facility]

## 2021-04-17 NOTE — Patient Instructions (Signed)
Medication Instructions:  NONE *If you need a refill on your cardiac medications before your next appointment, please call your pharmacy*   Lab Work: NONE If you have labs (blood work) drawn today and your tests are completely normal, you will receive your results only by: Marland Kitchen MyChart Message (if you have MyChart) OR . A paper copy in the mail If you have any lab test that is abnormal or we need to change your treatment, we will call you to review the results.   Testing/Procedures: NONE   Follow-Up: At [facility], you and your health needs are our priority.  As part of our continuing mission to provide you with exceptional heart care, we have created designated Provider Care Teams.  These Care Teams include your primary Cardiologist (physician) and Advanced Practice Providers (APPs -  Physician Assistants and Nurse Practitioners) who all work together to provide you with the care you need, when you need it.  We recommend signing up for the patient portal called "MyChart".  Sign up information is provided on this After Visit Summary.  MyChart is used to connect with patients for Virtual Visits (Telemedicine).  Patients are able to view lab/test results, encounter notes, upcoming appointments, etc.  Non-urgent messages can be sent to your provider as well.   To learn more about what you can do with MyChart, go to [email or web address].    Your next appointment:   6 month(s)  The format for your next appointment:   In Person  Provider:   Thomasene Ripple, DO   Other Instructions

## 2021-08-12 ENCOUNTER — Emergency Department (HOSPITAL_COMMUNITY): Payer: Medicare Other

## 2021-08-12 ENCOUNTER — Encounter (HOSPITAL_COMMUNITY): Payer: Self-pay

## 2021-08-12 ENCOUNTER — Inpatient Hospital Stay (HOSPITAL_COMMUNITY)
Admission: EM | Admit: 2021-08-12 | Discharge: 2021-08-18 | DRG: 291 | Disposition: A | Discharge status: Hospice | Payer: Medicare Other | Attending: Student in an Organized Health Care Education/Training Program | Admitting: Student in an Organized Health Care Education/Training Program

## 2021-08-12 DIAGNOSIS — G9340 Encephalopathy, unspecified: Secondary | ICD-10-CM | POA: Diagnosis not present

## 2021-08-12 DIAGNOSIS — N401 Enlarged prostate with lower urinary tract symptoms: Secondary | ICD-10-CM | POA: Diagnosis present

## 2021-08-12 DIAGNOSIS — Z87891 Personal history of nicotine dependence: Secondary | ICD-10-CM

## 2021-08-12 DIAGNOSIS — G9341 Metabolic encephalopathy: Secondary | ICD-10-CM | POA: Diagnosis present

## 2021-08-12 DIAGNOSIS — R296 Repeated falls: Secondary | ICD-10-CM | POA: Diagnosis present

## 2021-08-12 DIAGNOSIS — R310 Gross hematuria: Secondary | ICD-10-CM | POA: Diagnosis not present

## 2021-08-12 DIAGNOSIS — R0600 Dyspnea, unspecified: Secondary | ICD-10-CM

## 2021-08-12 DIAGNOSIS — S32010A Wedge compression fracture of first lumbar vertebra, initial encounter for closed fracture: Secondary | ICD-10-CM | POA: Diagnosis present

## 2021-08-12 DIAGNOSIS — S271XXA Traumatic hemothorax, initial encounter: Principal | ICD-10-CM | POA: Diagnosis present

## 2021-08-12 DIAGNOSIS — Z951 Presence of aortocoronary bypass graft: Secondary | ICD-10-CM

## 2021-08-12 DIAGNOSIS — I4891 Unspecified atrial fibrillation: Secondary | ICD-10-CM | POA: Diagnosis not present

## 2021-08-12 DIAGNOSIS — G309 Alzheimer's disease, unspecified: Secondary | ICD-10-CM | POA: Diagnosis present

## 2021-08-12 DIAGNOSIS — M1990 Unspecified osteoarthritis, unspecified site: Secondary | ICD-10-CM | POA: Diagnosis present

## 2021-08-12 DIAGNOSIS — F0280 Dementia in other diseases classified elsewhere without behavioral disturbance: Secondary | ICD-10-CM | POA: Diagnosis not present

## 2021-08-12 DIAGNOSIS — S2241XA Multiple fractures of ribs, right side, initial encounter for closed fracture: Secondary | ICD-10-CM | POA: Diagnosis present

## 2021-08-12 DIAGNOSIS — I110 Hypertensive heart disease with heart failure: Secondary | ICD-10-CM | POA: Diagnosis present

## 2021-08-12 DIAGNOSIS — Z515 Encounter for palliative care: Secondary | ICD-10-CM

## 2021-08-12 DIAGNOSIS — Z66 Do not resuscitate: Secondary | ICD-10-CM | POA: Diagnosis not present

## 2021-08-12 DIAGNOSIS — I2510 Atherosclerotic heart disease of native coronary artery without angina pectoris: Secondary | ICD-10-CM | POA: Diagnosis present

## 2021-08-12 DIAGNOSIS — I509 Heart failure, unspecified: Secondary | ICD-10-CM | POA: Diagnosis present

## 2021-08-12 DIAGNOSIS — W19XXXA Unspecified fall, initial encounter: Secondary | ICD-10-CM | POA: Diagnosis present

## 2021-08-12 DIAGNOSIS — Z8249 Family history of ischemic heart disease and other diseases of the circulatory system: Secondary | ICD-10-CM

## 2021-08-12 DIAGNOSIS — J449 Chronic obstructive pulmonary disease, unspecified: Secondary | ICD-10-CM | POA: Diagnosis present

## 2021-08-12 DIAGNOSIS — Z781 Physical restraint status: Secondary | ICD-10-CM

## 2021-08-12 DIAGNOSIS — J9691 Respiratory failure, unspecified with hypoxia: Secondary | ICD-10-CM

## 2021-08-12 DIAGNOSIS — Z79899 Other long term (current) drug therapy: Secondary | ICD-10-CM

## 2021-08-12 DIAGNOSIS — R338 Other retention of urine: Secondary | ICD-10-CM | POA: Diagnosis present

## 2021-08-12 DIAGNOSIS — Z952 Presence of prosthetic heart valve: Secondary | ICD-10-CM | POA: Diagnosis not present

## 2021-08-12 DIAGNOSIS — Z20822 Contact with and (suspected) exposure to covid-19: Secondary | ICD-10-CM | POA: Diagnosis present

## 2021-08-12 DIAGNOSIS — I482 Chronic atrial fibrillation, unspecified: Secondary | ICD-10-CM | POA: Diagnosis present

## 2021-08-12 DIAGNOSIS — Z7951 Long term (current) use of inhaled steroids: Secondary | ICD-10-CM

## 2021-08-12 DIAGNOSIS — R451 Restlessness and agitation: Secondary | ICD-10-CM | POA: Diagnosis not present

## 2021-08-12 DIAGNOSIS — E782 Mixed hyperlipidemia: Secondary | ICD-10-CM | POA: Diagnosis present

## 2021-08-12 DIAGNOSIS — J690 Pneumonitis due to inhalation of food and vomit: Secondary | ICD-10-CM | POA: Diagnosis not present

## 2021-08-12 DIAGNOSIS — S32018A Other fracture of first lumbar vertebra, initial encounter for closed fracture: Secondary | ICD-10-CM | POA: Diagnosis present

## 2021-08-12 DIAGNOSIS — Z7982 Long term (current) use of aspirin: Secondary | ICD-10-CM

## 2021-08-12 DIAGNOSIS — S2249XA Multiple fractures of ribs, unspecified side, initial encounter for closed fracture: Secondary | ICD-10-CM | POA: Diagnosis present

## 2021-08-12 DIAGNOSIS — Z809 Family history of malignant neoplasm, unspecified: Secondary | ICD-10-CM

## 2021-08-12 DIAGNOSIS — J9601 Acute respiratory failure with hypoxia: Secondary | ICD-10-CM | POA: Diagnosis present

## 2021-08-12 DIAGNOSIS — I5023 Acute on chronic systolic (congestive) heart failure: Secondary | ICD-10-CM | POA: Diagnosis present

## 2021-08-12 DIAGNOSIS — Z7189 Other specified counseling: Secondary | ICD-10-CM | POA: Diagnosis not present

## 2021-08-12 DIAGNOSIS — K219 Gastro-esophageal reflux disease without esophagitis: Secondary | ICD-10-CM | POA: Diagnosis present

## 2021-08-12 LAB — COMPREHENSIVE METABOLIC PANEL
ALT: 18 U/L (ref 0–44)
AST: 38 U/L (ref 15–41)
Albumin: 3.2 g/dL — ABNORMAL LOW (ref 3.5–5.0)
Alkaline Phosphatase: 46 U/L (ref 38–126)
Anion gap: 9 (ref 5–15)
BUN: 23 mg/dL (ref 8–23)
CO2: 25 mmol/L (ref 22–32)
Calcium: 8.8 mg/dL — ABNORMAL LOW (ref 8.9–10.3)
Chloride: 100 mmol/L (ref 98–111)
Creatinine, Ser: 1.78 mg/dL — ABNORMAL HIGH (ref 0.61–1.24)
GFR, Estimated: 37 mL/min — ABNORMAL LOW (ref >60)
Glucose, Bld: 134 mg/dL — ABNORMAL HIGH (ref 70–99)
Potassium: 4.3 mmol/L (ref 3.5–5.1)
Sodium: 134 mmol/L — ABNORMAL LOW (ref 135–145)
Total Bilirubin: 1.1 mg/dL (ref 0.3–1.2)
Total Protein: 6.5 g/dL (ref 6.5–8.1)

## 2021-08-12 LAB — TROPONIN I (HIGH SENSITIVITY)
Troponin I (High Sensitivity): 146 ng/L (ref <18)
Troponin I (High Sensitivity): 135 ng/L (ref <18)

## 2021-08-12 LAB — CBC WITH DIFFERENTIAL/PLATELET
Abs Immature Granulocytes: 0.08 10*3/uL — ABNORMAL HIGH (ref 0.00–0.07)
Basophils Absolute: 0 10*3/uL (ref 0.0–0.1)
Basophils Relative: 0 %
Eosinophils Absolute: 0 10*3/uL (ref 0.0–0.5)
Eosinophils Relative: 0 %
HCT: 35 % — ABNORMAL LOW (ref 39.0–52.0)
Hemoglobin: 11 g/dL — ABNORMAL LOW (ref 13.0–17.0)
Immature Granulocytes: 1 %
Lymphocytes Relative: 9 %
Lymphs Abs: 1.2 10*3/uL (ref 0.7–4.0)
MCH: 29.3 pg (ref 26.0–34.0)
MCHC: 31.4 g/dL (ref 30.0–36.0)
MCV: 93.1 fL (ref 80.0–100.0)
Monocytes Absolute: 1.2 10*3/uL — ABNORMAL HIGH (ref 0.1–1.0)
Monocytes Relative: 9 %
Neutro Abs: 10.8 10*3/uL — ABNORMAL HIGH (ref 1.7–7.7)
Neutrophils Relative %: 81 %
Platelets: 215 10*3/uL (ref 150–400)
RBC: 3.76 MIL/uL — ABNORMAL LOW (ref 4.22–5.81)
RDW: 14.2 % (ref 11.5–15.5)
WBC: 13.3 10*3/uL — ABNORMAL HIGH (ref 4.0–10.5)
nRBC: 0 % (ref 0.0–0.2)

## 2021-08-12 LAB — URINALYSIS, COMPLETE (UACMP) WITH MICROSCOPIC
Bacteria, UA: NONE SEEN
Bilirubin Urine: NEGATIVE
Glucose, UA: NEGATIVE mg/dL
Ketones, ur: NEGATIVE mg/dL
Leukocytes,Ua: NEGATIVE
Nitrite: NEGATIVE
Protein, ur: NEGATIVE mg/dL
Specific Gravity, Urine: 1.01 (ref 1.005–1.030)
pH: 5 (ref 5.0–8.0)

## 2021-08-12 LAB — RESP PANEL BY RT-PCR (FLU A&B, COVID) ARPGX2
Influenza A by PCR: NEGATIVE
Influenza B by PCR: NEGATIVE
SARS Coronavirus 2 by RT PCR: NEGATIVE

## 2021-08-12 LAB — BRAIN NATRIURETIC PEPTIDE: B Natriuretic Peptide: 1193.3 pg/mL — ABNORMAL HIGH (ref 0.0–100.0)

## 2021-08-12 MED ORDER — HYDROMORPHONE HCL 1 MG/ML IJ SOLN
1.0000 mg | Freq: Once | INTRAMUSCULAR | Status: AC
  Administered 2021-08-12: 1 mg via INTRAVENOUS
  Filled 2021-08-12: qty 1

## 2021-08-12 MED ORDER — IPRATROPIUM-ALBUTEROL 0.5-2.5 (3) MG/3ML IN SOLN
3.0000 mL | RESPIRATORY_TRACT | Status: DC
  Administered 2021-08-12 – 2021-08-13 (×7): 3 mL via RESPIRATORY_TRACT
  Filled 2021-08-12 (×6): qty 3

## 2021-08-12 MED ORDER — LOSARTAN POTASSIUM 25 MG PO TABS
25.0000 mg | ORAL_TABLET | Freq: Every day | ORAL | Status: DC
  Administered 2021-08-12 – 2021-08-18 (×6): 25 mg via ORAL
  Filled 2021-08-12 (×7): qty 1

## 2021-08-12 MED ORDER — FUROSEMIDE 10 MG/ML IJ SOLN: 40.0000 mg | Freq: Once | INTRAMUSCULAR | Status: DC

## 2021-08-12 MED ORDER — ASPIRIN EC 81 MG PO TBEC
81.0000 mg | DELAYED_RELEASE_TABLET | Freq: Every day | ORAL | Status: DC
  Administered 2021-08-13 – 2021-08-18 (×5): 81 mg via ORAL
  Filled 2021-08-12 (×6): qty 1

## 2021-08-12 MED ORDER — FUROSEMIDE 10 MG/ML IJ SOLN
40.0000 mg | Freq: Once | INTRAMUSCULAR | Status: AC
  Administered 2021-08-12: 40 mg via INTRAVENOUS
  Filled 2021-08-12: qty 4

## 2021-08-12 MED ORDER — SODIUM CHLORIDE 0.9% FLUSH
3.0000 mL | Freq: Two times a day (BID) | INTRAVENOUS | Status: DC
  Administered 2021-08-12 – 2021-08-18 (×10): 3 mL via INTRAVENOUS

## 2021-08-12 MED ORDER — ACETAMINOPHEN 500 MG PO TABS
1000.0000 mg | ORAL_TABLET | Freq: Three times a day (TID) | ORAL | Status: DC
  Administered 2021-08-12 – 2021-08-18 (×14): 1000 mg via ORAL
  Filled 2021-08-12 (×18): qty 2

## 2021-08-12 MED ORDER — ACETAMINOPHEN 325 MG PO TABS: 650.0000 mg | ORAL_TABLET | ORAL | Status: DC | PRN

## 2021-08-12 MED ORDER — SODIUM CHLORIDE 0.9% FLUSH: 3.0000 mL | INTRAVENOUS | Status: DC | PRN

## 2021-08-12 MED ORDER — SODIUM CHLORIDE 0.9 % IV SOLN: 250.0000 mL | INTRAVENOUS | Status: DC | PRN

## 2021-08-12 MED ORDER — FENTANYL CITRATE PF 50 MCG/ML IJ SOSY
50.0000 ug | PREFILLED_SYRINGE | Freq: Once | INTRAMUSCULAR | Status: AC
Start: 2021-08-12 — End: 2021-08-12
  Administered 2021-08-12: 50 ug via INTRAVENOUS
  Filled 2021-08-12: qty 1

## 2021-08-12 MED ORDER — TAMSULOSIN HCL 0.4 MG PO CAPS
0.4000 mg | ORAL_CAPSULE | Freq: Every day | ORAL | Status: DC
  Administered 2021-08-13 – 2021-08-18 (×5): 0.4 mg via ORAL
  Filled 2021-08-12 (×7): qty 1

## 2021-08-12 MED ORDER — FLUTICASONE FUROATE-VILANTEROL 100-25 MCG/INH IN AEPB
1.0000 | INHALATION_SPRAY | Freq: Every day | RESPIRATORY_TRACT | Status: DC
  Administered 2021-08-14 – 2021-08-18 (×3): 1 via RESPIRATORY_TRACT
  Filled 2021-08-12: qty 28

## 2021-08-12 MED ORDER — ENOXAPARIN SODIUM 40 MG/0.4ML IJ SOSY
40.0000 mg | PREFILLED_SYRINGE | INTRAMUSCULAR | Status: DC
  Administered 2021-08-12 – 2021-08-17 (×6): 40 mg via SUBCUTANEOUS
  Filled 2021-08-12 (×6): qty 0.4

## 2021-08-12 MED ORDER — FUROSEMIDE 10 MG/ML IJ SOLN
80.0000 mg | Freq: Once | INTRAMUSCULAR | Status: AC
  Administered 2021-08-12: 80 mg via INTRAVENOUS
  Filled 2021-08-12: qty 8

## 2021-08-12 MED ORDER — OXYCODONE HCL 5 MG/5ML PO SOLN: 2.5000 mg | ORAL | Status: DC | PRN

## 2021-08-12 MED ORDER — METHOCARBAMOL 500 MG PO TABS
1000.0000 mg | ORAL_TABLET | Freq: Three times a day (TID) | ORAL | Status: DC
  Administered 2021-08-12 – 2021-08-18 (×16): 1000 mg via ORAL
  Filled 2021-08-12 (×19): qty 2

## 2021-08-12 MED ORDER — LORAZEPAM 2 MG/ML IJ SOLN
2.0000 mg | Freq: Once | INTRAMUSCULAR | Status: AC
  Administered 2021-08-12: 2 mg via INTRAVENOUS

## 2021-08-12 MED ORDER — UMECLIDINIUM BROMIDE 62.5 MCG/INH IN AEPB
1.0000 | INHALATION_SPRAY | Freq: Every day | RESPIRATORY_TRACT | Status: DC
  Administered 2021-08-14 – 2021-08-18 (×3): 1 via RESPIRATORY_TRACT
  Filled 2021-08-12: qty 7

## 2021-08-12 MED ORDER — MORPHINE SULFATE (PF) 2 MG/ML IV SOLN
1.0000 mg | INTRAVENOUS | Status: DC | PRN
  Administered 2021-08-13 (×2): 2 mg via INTRAVENOUS
  Administered 2021-08-13: 1 mg via INTRAVENOUS
  Administered 2021-08-13 – 2021-08-17 (×3): 2 mg via INTRAVENOUS
  Administered 2021-08-18: 1 mg via INTRAVENOUS
  Administered 2021-08-18: 2 mg via INTRAVENOUS
  Filled 2021-08-12 (×8): qty 1

## 2021-08-12 NOTE — Progress Notes (Signed)
   08/12/21 1530  Assess: MEWS Score  BP (!) 153/86  Pulse Rate 91  ECG Heart Rate 91  Resp (!) 29  SpO2 98 %  Assess: MEWS Score  MEWS Temp 0  MEWS Systolic 0  MEWS Pulse 0  MEWS RR 2  MEWS LOC 0  MEWS Score 2  MEWS Score Color Yellow  Assess: if the MEWS score is Yellow or Red  Were vital signs taken at a resting state? No  Focused Assessment No change from prior assessment  Early Detection of Sepsis Score *See Row Information* Low  MEWS guidelines implemented *See Row Information* Yes  Treat  MEWS Interventions Escalated (See documentation below)  Pain Scale Faces  Faces Pain Scale 2  Take Vital Signs  Increase Vital Sign Frequency  Yellow: Q 2hr X 2 then Q 4hr X 2, if remains yellow, continue Q 4hrs  Escalate  MEWS: Escalate Yellow: discuss with charge nurse/RN and consider discussing with provider and RRT  Notify: Charge Nurse/RN  Name of Charge Nurse/RN Notified Judeth Cornfield RN  Date Charge Nurse/RN Notified 08/12/21  Time Charge Nurse/RN Notified 1530  Notify: Provider  Provider Name/Title Dr. Ephriam Knuckles  Date Provider Notified 08/12/21  Time Provider Notified 1540  Notification Type Call  Notification Reason Other (Comment) (Agitation)  Provider response See new orders  Date of Provider Response 08/12/21  Time of Provider Response 1540  Notify: Rapid Response  Name of Rapid Response RN Notified Patric RN  Date Rapid Response Notified 08/12/21  Time Rapid Response Notified 1600  Document  Patient Outcome Stabilized after interventions  Progress note created (see row info) Yes  Received pt with agitation, on 4 point restraints accompanied by his daughter.  Dr. Ephriam Knuckles made aware and received new orders.

## 2021-08-12 NOTE — ED Notes (Signed)
Pt w/ multiple attempts to get out of bed despite education to stay in bed for safety

## 2021-08-12 NOTE — ED Notes (Signed)
Pt transported to CT at this time.

## 2021-08-12 NOTE — ED Notes (Signed)
Found pt up and out of bed. Pt pulled monitoring leads off. Pt states he is at home and thinks someone is at back door.

## 2021-08-12 NOTE — ED Notes (Signed)
Pt placed in restraints to maintain safety. Pt unable to follow commands and understand safety precautions to stay in bed d/t high fall risk and need for O2. Multiple attempts to try to reorient pt and explain why he needs to stay in bed. Pt unable to verbalize understanding at this time.

## 2021-08-12 NOTE — ED Notes (Signed)
Pt continues to remove cardiac monitoring , pulse ox and bp. States he wants soft restraints removed. States as soon as this tech walks out of room pt is going to kill himself.

## 2021-08-12 NOTE — ED Notes (Signed)
Pt back from CT at this time 

## 2021-08-12 NOTE — ED Notes (Signed)
Called staffing to request Recruitment consultant. Per staffing no sitters available at this time and will send one when available.

## 2021-08-12 NOTE — ED Notes (Signed)
Pt asking for scissors. Explained to pt that we could not give him scissors. Pt stated, "I'm going to get scissors and cut your throat".

## 2021-08-12 NOTE — Consult Note (Signed)
Reason for Consult/Chief Complaint:rib fractures Consultant: Schirmer, PA  [name] [name] is an 86 y.o. male.   HPI: 89M s/p mechanical GLF while going to the bathroom. Denies hitting his head. Unsure LOC. Presented to OSH ED and was found to have two rib fractures and report of associated hemothorax, requested transfer for management. Required 3L supplemental O2 to maintain sats.  Past Medical History:  Diagnosis Date   Arrhythmia    chronic atrial fibrillation   Arthritis    Atrial fibrillation (HCC)    chronic atrial fibrillation    CHF (congestive heart failure) (HCC)    most recent EF 50-55%   COPD (chronic obstructive pulmonary disease) (HCC)    chronic   Coronary artery disease    Depression    Dizziness    Fatigue    Heart murmur    Hernia NEC    hiatel   Hyperlipidemia    Hypertension    Reflux    Scarlet fever     Past Surgical History:  Procedure Laterality Date   AORTIC VALVE REPLACEMENT  2006   33-mm pericardial Edward's valve   CARDIAC CATHETERIZATION  09/13/2010   patent grafts. no significant pulmonary hypertension.    CORONARY ARTERY BYPASS GRAFT  2006   [place]    Family History  Problem Relation Age of Onset   Cancer Mother    Heart attack Father    Heart attack Brother    Cancer Brother     Social History:  reports that he quit smoking about 40 years ago. He has never used smokeless tobacco. He reports that he does not drink alcohol and does not use drugs.  Allergies: No Known Allergies  Medications: I have reviewed the patient's current medications.  Results for orders placed or performed during the hospital encounter of 08/12/21 (from the past 48 hour(s))  Comprehensive metabolic panel     Status: Abnormal   Collection Time: 08/12/21  4:57 AM  Result Value Ref Range   Sodium 134 (L) 135 - 145 mmol/L   Potassium 4.3 3.5 - 5.1 mmol/L   Chloride 100 98 - 111 mmol/L   CO2 25 22 - 32 mmol/L   Glucose, Bld 134 (H) 70 - 99 mg/dL     Comment: Glucose reference range applies only to samples taken after fasting for at least 8 hours.   BUN 23 8 - 23 mg/dL   Creatinine, Ser 7.00 (H) 0.61 - 1.24 mg/dL   Calcium 8.8 (L) 8.9 - 10.3 mg/dL   Total Protein 6.5 6.5 - 8.1 g/dL   Albumin 3.2 (L) 3.5 - 5.0 g/dL   AST 38 15 - 41 U/L   ALT 18 0 - 44 U/L   Alkaline Phosphatase 46 38 - 126 U/L   Total Bilirubin 1.1 0.3 - 1.2 mg/dL   GFR, Estimated 37 (L) >60 mL/min    Comment: (NOTE) Calculated using the CKD-EPI Creatinine Equation (2021)    Anion gap 9 5 - 15    Comment: Performed at [facility], [address], [place], [place] [address]  Brain natriuretic peptide     Status: Abnormal   Collection Time: 08/12/21  4:57 AM  Result Value Ref Range   B Natriuretic Peptide 1,193.3 (H) 0.0 - 100.0 pg/mL    Comment: Performed at [facility], [address] [place], [place] [address]  Troponin I (High Sensitivity)     Status: Abnormal   Collection Time: 08/12/21  4:57  AM  Result Value Ref Range   Troponin I (High Sensitivity) 146 (HH) <18 ng/L    Comment: CRITICAL RESULT CALLED TO, READ BACK BY AND VERIFIED WITH: Rhona Leavens 08/12/21 0630 WAYK Performed at [facility], [address], [place], [place] [address]   CBC with Differential     Status: Abnormal   Collection Time: 08/12/21  4:57 AM  Result Value Ref Range   WBC 13.3 (H) 4.0 - 10.5 K/uL   RBC 3.76 (L) 4.22 - 5.81 MIL/uL   Hemoglobin 11.0 (L) 13.0 - 17.0 g/dL   HCT 81.0 (L) 17.5 - 10.2 %   MCV 93.1 80.0 - 100.0 fL   MCH 29.3 26.0 - 34.0 pg   MCHC 31.4 30.0 - 36.0 g/dL   RDW 58.5 27.7 - 82.4 %   Platelets 215 150 - 400 K/uL   nRBC 0.0 0.0 - 0.2 %   Neutrophils Relative % 81 %   Neutro Abs 10.8 (H) 1.7 - 7.7 K/uL   Lymphocytes Relative 9 %   Lymphs Abs 1.2 0.7 - 4.0 K/uL   Monocytes Relative 9 %   Monocytes Absolute 1.2 (H) 0.1 - 1.0 K/uL   Eosinophils Relative 0 %   Eosinophils Absolute 0.0 0.0 - 0.5 K/uL   Basophils Relative 0 %    Basophils Absolute 0.0 0.0 - 0.1 K/uL   Immature Granulocytes 1 %   Abs Immature Granulocytes 0.08 (H) 0.00 - 0.07 K/uL    Comment: Performed at [facility], [address] [place], [place] [address]  Resp Panel by RT-PCR (Flu A&B, Covid) Nasopharyngeal Swab     Status: None   Collection Time: 08/12/21  5:04 AM   Specimen: Nasopharyngeal Swab; Nasopharyngeal(NP) swabs in vial transport medium  Result Value Ref Range   SARS Coronavirus 2 by RT PCR NEGATIVE NEGATIVE    Comment: (NOTE) SARS-CoV-2 target nucleic acids are NOT DETECTED.  The SARS-CoV-2 RNA is generally detectable in upper respiratory specimens during the acute phase of infection. The lowest concentration of SARS-CoV-2 viral copies this assay can detect is 138 copies/mL. A negative result does not preclude SARS-Cov-2 infection and should not be used as the sole basis for treatment or other patient management decisions. A negative result may occur with  improper specimen collection/handling, submission of specimen other than nasopharyngeal swab, presence of viral mutation(s) within the areas targeted by this assay, and inadequate number of viral copies(<138 copies/mL). A negative result must be combined with clinical observations, patient history, and epidemiological information. The expected result is Negative.  Fact Sheet for Patients:  BloggerCourse.com  Fact Sheet for Healthcare Providers:  SeriousBroker.it  This test is no t yet approved or cleared by the [place] FDA and  has been authorized for detection and/or diagnosis of SARS-CoV-2 by FDA under an Emergency Use Authorization (EUA). This EUA will remain  in effect (meaning this test can be used) for the duration of the COVID-19 declaration under Section 564(b)(1) of the Act, 21 U.S.C.section 360bbb-3(b)(1), unless the authorization is terminated  or revoked sooner.       Influenza A by PCR  NEGATIVE NEGATIVE   Influenza B by PCR NEGATIVE NEGATIVE    Comment: (NOTE) The Xpert Xpress SARS-CoV-2/FLU/RSV plus assay is intended as an aid in the diagnosis of influenza from Nasopharyngeal swab specimens and should not be used as a sole basis for treatment. Nasal washings and aspirates are unacceptable for Xpert Xpress SARS-CoV-2/FLU/RSV testing.  Fact Sheet for Patients:  BloggerCourse.com  Fact Sheet for Healthcare Providers: SeriousBroker.it  This test is not yet approved or cleared by the [place] FDA and has been authorized for detection and/or diagnosis of SARS-CoV-2 by FDA under an Emergency Use Authorization (EUA). This EUA will remain in effect (meaning this test can be used) for the duration of the COVID-19 declaration under Section 564(b)(1) of the Act, 21 U.S.C. section 360bbb-3(b)(1), unless the authorization is terminated or revoked.  Performed at [facility], [address], [place], [place] [address]     CT ABDOMEN PELVIS WO CONTRAST  Result Date: 08/12/2021 CLINICAL DATA:  86 year old male status post fall. Right lateral rib fractures with pleural effusion versus hemothorax. EXAM: CT ABDOMEN AND PELVIS WITHOUT CONTRAST TECHNIQUE: Multidetector CT imaging of the abdomen and pelvis was performed following the standard protocol without IV contrast. COMPARISON:  [facility] CT chest 0004 hours today. Portable chest 0503 hours today. FINDINGS: Lower chest: Cardiomegaly redemonstrated. No pericardial effusion. Allowing for motion artifact on the earlier CT today, layering small pleural effusions are stable, and that on the right has simple fluid density arguing in favor of transudate rather than hemothorax. Confluent but stable lung base atelectasis. No pneumothorax at the lung bases. Hepatobiliary: Mild motion artifact. Grossly negative noncontrast liver and gallbladder. Pancreas: Atrophied. Spleen:  Diminutive, negative. Adrenals/Urinary Tract: Normal adrenal glands. Nonobstructed kidneys. Renal vascular calcifications. There are several small round isodense exophytic renal lesions, such as the 16 mm lesion at the right lower pole on coronal image 66. These are probably benign proteinaceous cysts. Decompressed ureters. Unremarkable bladder. Stomach/Bowel: Redundant large bowel. Extensive diverticulosis from the hepatic flexure to the sigmoid colon. No active inflammation. Negative appendix on series 4, image 67. Decompressed and negative terminal ileum. No dilated small bowel. Round 5 cm umbilical hernia contains fat on series 4, image 59. Negative stomach and duodenum. No free air or free fluid. Vascular/Lymphatic: Extensive Aortoiliac calcified atherosclerosis. Normal caliber abdominal aorta. No lymphadenopathy. Reproductive: Prostatomegaly. Small fat containing right inguinal hernia. Other: No pelvic free fluid. Musculoskeletal: Prior sternotomy. Nondisplaced right lateral 7th and mildly displaced right lateral 6th rib fractures. The remaining lower ribs appear to be intact. Mild to moderate L1 compression fracture however appears likely to be acute with unhealed superior endplate lucency. 35% loss of vertebral body height. No retropulsion. The T12 posterior elements appear intact. Other visible lower thoracic and lumbar vertebrae appear intact. Sacrum, SI joints, pelvis and proximal femurs appear intact. IMPRESSION: 1. Right lateral 6th and 7th rib fractures redemonstrated. Small layering pleural effusions right > left are stable since 0004 hours today, and simple fluid density argues in favor of transudate and against hemothorax. 2. Acute appearing L1 compression fracture with 35% loss of vertebral body height. No retropulsion or other complicating features. 3. No other acute traumatic injury identified in the non-contrast abdomen or pelvis. 4. Cardiomegaly. Large bowel diverticulosis. Prostatomegaly.  Aortic Atherosclerosis (ICD10-I70.0). Electronically Signed   By: Odessa Fleming M.D.   On: 08/12/2021 07:48   DG Chest Portable 1 View  Result Date: 08/12/2021 CLINICAL DATA:  86 year old male status post fall. Right lateral rib fractures with pleural effusion versus hemothorax. EXAM: PORTABLE CHEST 1 VIEW COMPARISON:  Chest CT 0004 hours today and earlier. FINDINGS: Portable AP semi upright view at 0503 hours. Improved lung volumes from yesterday. Stable cardiomegaly and mediastinal contours. Prior CABG and cardiac valve replacement. No pneumothorax. Nondisplaced right rib fractures better demonstrated by CT. Patchy lung base opacity is stable from yesterday, without evidence  of progressed small pleural effusions better demonstrated by CT. No areas of worsening ventilation. IMPRESSION: 1. No pneumothorax and stable appearance of small pleural effusions, no evidence of progressive hemothorax. 2. Nondisplaced right rib fractures better demonstrated by CT. 3. Cardiomegaly. Electronically Signed   By: H  Hall M.D.   On: 08/12/2021 05:46    ROS 10 Odessa Flemingpoint review of systems is negative except as listed above in HPI.   Physical Exam Blood pressure (!) 128/56, pulse 77, temperature 98.2 F (36.8 C), temperature source Oral, resp. rate (!) 21, height 5\' 9"  (1.753 m), weight 99.8 kg, SpO2 97 %. Constitutional: well-developed, well-nourished HEENT: pupils equal, round, reactive to light, 2mm b/l, moist conjunctiva, external inspection of ears and nose normal, hearing intact Oropharynx: normal oropharyngeal mucosa, normal dentition Neck: no thyromegaly, trachea midline, no midline cervical tenderness to palpation Chest: breath sounds equal bilaterally, moderately labored respiratory effort with audible wheezing, + lateral chest wall tenderness to palpation without deformity Abdomen: soft, NT, no bruising, no hepatosplenomegaly GU: no blood at urethral meatus of penis, no scrotal masses or abnormality  Back: no  wounds, + lower thoracic/upper lumbar spine tenderness to palpation, no thoracic/lumbar spine stepoffs Rectal: deferred Extremities: 2+ radial and pedal pulses bilaterally, motor and sensation intact to bilateral UE and LE, no peripheral edema MSK: unable to assess gait/station, no clubbing/cyanosis of fingers/toes, normal ROM of all four extremities Skin: warm, dry, no rashes Psych: normal memory, normal mood/affect    Assessment/Plan: 30M GLF  Rib fx - pain control, pulm toilet/IS Pleural effusions, b/l - likely related to underlying chronic illness and not hemothorax L1 compression fracture - NSGY c/s, notified at 0751 BNP nearly 1200, troponin 146, multipl co-morbid conditions - recommend admission to medicine service for management of multiple medical co-morbidities FEN - okay for regular diet from trauma standpoint DVT - recommend DVT prophylaxis with LMWH 40mg  BID as long as GFR>30 Dispo - recommend admission to medicine service as above. Trauma will follow for pain management of rib fractures. Recommend PT/OT evals.    Diamantina MonksAyesha N. Hellen Shanley, MD General and Trauma Surgery [facility]

## 2021-08-12 NOTE — ED Provider Notes (Signed)
  9:32 AM Patient signed out to me by previous ED physician.  Pt is a 86 yo male presenting as transfer for fall/trauma with rib fractures. Patient found to be in CHF exacerbation with elevated troponins likely secondary to ischemic demand. Lasix 40 gm iv given by previous provider. I spoke with trauma surgery who has cleared patient. I spoke with admitting physician who agrees to accept patient.    Edwin Dada P, DO 08/12/21 [phone]

## 2021-08-12 NOTE — ED Triage Notes (Addendum)
Pt arrived in er short of breath, has broken ribs and seems very uncomfortable, has some mild dementia, and is forgetting limits. Is short of breath, having pain in the rt rib area

## 2021-08-12 NOTE — H&P (Addendum)
Date: 08/12/2021               Patient Name:  [name] [name] MRN: [id number]  DOB: 31-May-1935 Age / Sex: 86 y.o., male   PCP: Dema SeverinYork, Regina F, NP         Medical Service: Internal Medicine Teaching Service         Attending Physician: Dr. Franne FortsGray, Alicia P, DO    First Contact: Dr. Welton FlakesKhan Pager: [phone]  Second Contact: Dr. Ephriam Knuckleshristian Pager: [phone]       After Hours (After 5p/  First Contact Pager: [phone]  weekends / holidays): Second Contact Pager: [phone]   Chief Complaint: Fall  History of Present Illness:  Patient is 86 year old male with past medical history of A fib, CHF, COPD, CAD, HLD, HTN, GERD, and depression that presented from OSH after a fall that resulted in multiple rib fractures and spinal fracture. Patient has baseline dementia but has been altered for the last two weeks per the daughter. He was in restraints due to agitation and wanting to ambulate previously. He is able to recall events from long term memory but not short term. He was not able to recall the fall to which the daughter stated she got a call yesterday at 4 pm after his father fell. She called EMS but patient refused to go to the hospital. Patient then called her again at 9 pm and complained of pain to which the daughter visited him and brought him to the ED. He was found to have broken ribs and spinal fracture that caused [facility] to [facility]. Per daughter, patient has been altered for two weeks, and has had multiple episodes of fall witnessed by neighbors. During my interview, patient kept getting distracted and asking if this is a rental place. He was able to tell me his past medical problems but unable to recall the medications. History completed with assistance of daughter, POA, through phone call.   Review of Systems:Patient appears to be able to respond but has altered status Review of Systems  Constitutional:  Negative for chills and fever.  HENT:  Positive for hearing loss and tinnitus.    Eyes:  Negative for blurred vision and double vision.  Respiratory:  Positive for shortness of breath. Negative for cough.   Cardiovascular:  Negative for chest pain and palpitations.  Gastrointestinal:  Negative for heartburn and nausea.  Genitourinary:  Negative for dysuria and urgency.  Musculoskeletal:  Positive for back pain and falls.  Skin:  Negative for itching and rash.  Neurological:  Positive for dizziness and weakness. Negative for headaches.  Psychiatric/Behavioral:  Positive for memory loss. Negative for hallucinations. The patient has insomnia.   Meds: Medications verified with daughter No current facility-administered medications on file prior to encounter.   Current Outpatient Medications on File Prior to Encounter  Medication Sig Dispense Refill   albuterol (PROVENTIL) (2.5 MG/3ML) 0.083% nebulizer solution Inhale 2.5 mLs into the lungs every 6 (six) hours as needed for wheezing.     aspirin 81 MG tablet Take 81 mg by mouth daily.     furosemide (LASIX) 40 MG tablet Take 1 tablet (40 mg total) by mouth daily. 90 tablet 1   losartan (COZAAR) 25 MG tablet Take 1 tablet (25 mg total) by mouth daily. 90 tablet 1   potassium chloride (KLOR-CON) 10 MEQ CR tablet Take 20 mEq by mouth daily.     TRELEGY ELLIPTA 100-62.5-25 MCG/INH AEPB Inhale 1 puff  into the lungs daily as needed (For shortness of breath).     vitamin B-12 (CYANOCOBALAMIN) 1000 MCG tablet Take 1,000 mcg by mouth daily.       Allergies: No allergies endorsed.  Allergies as of 08/12/2021   (No Known Allergies)   Past Medical History:  Diagnosis Date   Arrhythmia    chronic atrial fibrillation   Arthritis    Atrial fibrillation (HCC)    chronic atrial fibrillation    CHF (congestive heart failure) (HCC)    most recent EF 50-55%   COPD (chronic obstructive pulmonary disease) (HCC)    chronic   Coronary artery disease    Depression    Dizziness    Fatigue    Heart murmur    Hernia NEC    hiatel    Hyperlipidemia    Hypertension    Reflux    Scarlet fever     Family History:  Family History  Problem Relation Age of Onset   Cancer Mother    Heart attack Father    Heart attack Brother    Cancer Brother    Social History: States he lives by himself. Has two daughters and one lives close by. Does not currently smoke, drink or do illicit drugs.  Social History   Socioeconomic History   Marital status: Divorced    Spouse name: Not on file   Number of children: Not on file   Years of education: Not on file   Highest education level: Not on file  Occupational History   Not on file  Tobacco Use   Smoking status: Former    Types: Cigarettes    Quit date: 12/17/1980    Years since quitting: 40.6   Smokeless tobacco: Never  Substance and Sexual Activity   Alcohol use: No   Drug use: No   Sexual activity: Not Currently  Other Topics Concern   Not on file  Social History Narrative   Not on file   Social Determinants of Health   Financial Resource Strain: Not on file  Food Insecurity: Not on file  Transportation Needs: Not on file  Physical Activity: Not on file  Stress: Not on file  Social Connections: Not on file  Intimate Partner Violence: Not on file     Physical Exam: Blood pressure (!) 161/63, pulse 90, temperature 98.2 F (36.8 C), temperature source Oral, resp. rate (!) 22, height 5\' 9"  (1.753 m), weight 99.8 kg, SpO2 98 %. Physical Exam Constitutional:      General: He is not in acute distress.    Appearance: He is obese. He is not toxic-appearing.  HENT:     Head: Normocephalic and atraumatic.     Right Ear: External ear normal.     Left Ear: External ear normal.     Nose: Nose normal. No rhinorrhea.     Mouth/Throat:     Mouth: Mucous membranes are moist.     Pharynx: Oropharynx is clear.     Comments: Tongue has bilateral bruising Eyes:     Extraocular Movements: Extraocular movements intact.     Pupils: Pupils are equal, round, and reactive to  light.  Cardiovascular:     Rate and Rhythm: Normal rate. Rhythm irregular.     Pulses: Normal pulses.     Heart sounds: Normal heart sounds.  Pulmonary:     Effort: Pulmonary effort is normal. No respiratory distress.     Breath sounds: Wheezing and rales present.  Abdominal:  Palpations: Abdomen is soft.     Tenderness: There is abdominal tenderness (right sided tenderness).  Musculoskeletal:        General: No swelling. Normal range of motion.     Cervical back: Normal range of motion and neck supple.     Right lower leg: Edema (trace) present.     Left lower leg: Edema (trace) present.  Skin:    General: Skin is warm and dry.     Capillary Refill: Capillary refill takes less than 2 seconds.     Findings: No erythema.  Neurological:     General: No focal deficit present.     Mental Status: He is alert. He is disoriented.     Cranial Nerves: No cranial nerve deficit.     Motor: No weakness.  Psychiatric:     Comments: Alert to person and time but not place.  Not agitated, but has some hallcuicnattion   CBC Latest Ref Rng & Units 08/12/2021 03/02/2021 05/02/2014  WBC 4.0 - 10.5 K/uL 13.3(H) 9.3 6.7  Hemoglobin 13.0 - 17.0 g/dL 11.0(L) 12.4(L) 12.4(L)  Hematocrit 39.0 - 52.0 % 35.0(L) 38.3 37.9(L)  Platelets 150 - 400 K/uL 215 299 244    CMP Latest Ref Rng & Units 08/12/2021 03/02/2021 05/02/2014  Glucose 70 - 99 mg/dL 621(H) 87 086(V)  BUN 8 - 23 mg/dL 23 16 78(I)  Creatinine 0.61 - 1.24 mg/dL 6.96(E) 9.52(W) 4.13(K)  Sodium 135 - 145 mmol/L 134(L) 137 140  Potassium 3.5 - 5.1 mmol/L 4.3 4.7 4.3  Chloride 98 - 111 mmol/L 100 96 101  CO2 22 - 32 mmol/L 25 24 28   Calcium 8.9 - 10.3 mg/dL ) 9.4 9.1  Total Protein 6.5 - 8.1 g/dL 6.5 - -  Total Bilirubin 0.3 - 1.2 mg/dL 1.1 - -  Alkaline Phos 38 - 126 U/L 46 - -  AST 15 - 41 U/L 38 - -  ALT 0 - 44 U/L 18 - -    Troponin: 135, 146 BNP 1,193 EKG: personally reviewed my interpretation is atrial fibrillation.   CXR:  personally reviewed my interpretation is small pleural effusion, and rib fractures.  Assessment & Plan by Problem: Active Problems:   * No active hospital problems. *  Altered Mental Status suspect Acute metabolic encephalopathy as most likely cause  Patient has altered mental status that is beyond his baseline dementia. The daughter states patient has been having alteration in mental status for the last 2 weeks. Ddx infectious etiology vs seizures vs worsening dementia vs psychosis 2/2 to depression vs delirium vs acute hypoxic encephalopathy. Infectious etiology is considered due to leukocytosis and abrupt change in mental status. The seizures is considered due to bilateral tongue and frequent falling episodes. Psychosis was considered due to abrupt change along with visual hallucinations. Another likely could be hypoxic encephalopathy as patient has COPD but has been inconsistent in his use of his inhalers. Least likely is progression of dementia as the change is abrupt and not progressive per the daughter.   Plan: -CBC w/ differential to look for left shift -EEG to rule out seizures -Control pain and continue to monitor -Consider blood cx although no signs of fever if other etiologies are negative  Rib fracture/spinal fracture 2/2 Fall Patient has multiple rib fractures with imaging showing non-displaced right lateral 7th and mildly displaced right lateral 6th rib fractures. It also shows L1 compression fracture.  Plan: -NSG consulted for spinal fracture by trauma surgery -Trauma surgery recommends pain control, pulmonary toilet,  incentive spirometry  Atrial fibrillation Patient has past medical history of a-fib but is not on anticoagulation per family's preferences as he has frequent falls. No on beta blocker due to family preference as well.   Plan: -Consider diltiazem with family  Acute on chronic systolicCHF Patient has CHF with EF of 30-35% with severe dilation of left atrium.  Currently on home lasix 40 mg daily. His BNP was 1,193 on 08/12/21.  Plan: IV lasix until euvolemic  COPD Patient has COPD with extensive smoking history. He has not been using his inhalers regularly per the daughter.   Plan: -Duonebs q 4hrs prn -Breo and Incruse  Hypertension: Patient has hypertension on Losartan 25 mg. He was hypertensive in the ED with 161/63. May be hypertensive due to pain 2/2 to multiple fractures.  Plan: -Continue home Losartan 25 mg daily -Continue to monitor   Hematuria: Patient's urinalysis was positive for hgb with 21-50 RBCs per high power field. This may be secondary to injury sustained during the fall vs traumatic catheterization vs BPH vs malignancy. Daughter mentioned patient had prostate cancer diagnosis 30 years ago but stated his recent PSA was wnl.   Plan: -Continue to monitor -Would repeat outpatient to see if it persists.  -Considering renal ultrasound as patient has small renal lesions on non contrast CT of abdomen/pelvis   DVT prophx: Lovenox Diet:Cardiac Bowel:PRN Code:DNR  Prior to Admission Living Arrangement:Home Anticipated Discharge Location:SNF Barriers to Discharge:Medical Clearance  Dispo: Admit patient to Inpatient with expected length of stay greater than 2 midnights.  Signed: Gwenevere Abbot, MD 08/12/2021, 10:39 AM  Pager: [phone]

## 2021-08-12 NOTE — Evaluation (Signed)
Physical Therapy Evaluation Patient Details Name: [name] [name] MRN: [id number] DOB: 1935/05/14 Today's Date: 08/12/2021   History of Present Illness  86 y.o. male admitted on 08/12/21 after a ground level fall sustaining R lateral 6th and 7th rib fxs, acute L1 compression fx (NSYG consult pending), bil pleural effusions.  Pt with significant PMH of chronic A fib, CHF, COPD, CAD, dizziness, HTN, AVR, CABG.  Clinical Impression  Pt was attempting to get up out of bed as I walked into the room, min assist needed to stand EOB with 4 wheeled RW to attempt to urinate in the urinal (ended up urinating on the floor).  Pt is confused and I am unsure if this is more than baseline or about the same as it seems like he is checked on quite frequently by neighbors, has meals on wheels in place, and family (2 daughters) took his car away.  He does require O2 at this time, desaturating to 87% on RA during mobility.   PT to follow acutely for deficits listed below.       Follow Up Recommendations SNF    Equipment Recommendations  Other (comment) (possible home O2)    Recommendations for Other Services       Precautions / Restrictions Precautions Precautions: Fall;Other (comment) Precaution Comments: monitor O2 sats, new O2 (did not use PTA)      Mobility  Bed Mobility Overal bed mobility: Needs Assistance Bed Mobility: Rolling;Sidelying to Sit;Sit to Sidelying Rolling: Min assist Sidelying to sit: Min assist     Sit to sidelying: Mod assist General bed mobility comments: Min assist to roll to side and come up from side to sitting EOB, mod assis to help lift both legs back into bed from sitting to side lying.    Transfers Overall transfer level: Needs assistance Equipment used: 4-wheeled walker Transfers: Sit to/from Stand Sit to Stand: Min assist         General transfer comment: stood x2 with support of RW to use urinal, however, pt urinated on the floor and not in urinal in  standing.  Hard to visualize due to the size of his stomach/body habitus.  Ambulation/Gait         Gait velocity: NT due to space in room.      Stairs            Wheelchair Mobility    Modified Rankin (Stroke Patients Only)       Balance Overall balance assessment: Needs assistance Sitting-balance support: Feet supported;Bilateral upper extremity supported Sitting balance-Leahy Scale: Fair     Standing balance support: Bilateral upper extremity supported;Single extremity supported Standing balance-Leahy Scale: Poor Standing balance comment: needs min assist and at least one upper extremity supported in standing.                             Pertinent Vitals/Pain Pain Assessment: Faces Faces Pain Scale: Hurts little more Pain Location: right ribs Pain Descriptors / Indicators: Guarding;Grimacing Pain Intervention(s): Limited activity within patient's tolerance;Monitored during session;Repositioned    Home Living Family/patient expects to be discharged to:: Private residence Living Arrangements: Alone ("I'm a bachelor") Available Help at Discharge: Family;Neighbor;Available PRN/intermittently Type of Home: House Home Access: Stairs to enter   [employer] of Steps: 1 Home Layout: One level Home Equipment: Walker - 4 wheels      Prior Function Level of Independence: Independent with assistive device(s)         Comments:  per pt he uses rollator at home, no O2 use PTA     Hand Dominance   Dominant Hand: Right    Extremity/Trunk Assessment   Upper Extremity Assessment Upper Extremity Assessment: Defer to OT evaluation    Lower Extremity Assessment Lower Extremity Assessment: Generalized weakness (no reports of numbness, no buckling in stanidng)    Cervical / Trunk Assessment Cervical / Trunk Assessment: Other exceptions Cervical / Trunk Exceptions: Has active L1 compression fx, not retropulsed, so felt safe to mobilize   Communication   Communication: HOH (very HOH)  Cognition Arousal/Alertness: Awake/alert Behavior During Therapy: Restless Overall Cognitive Status: Impaired/Different from baseline Area of Impairment: Orientation;Attention;Memory;Following commands;Safety/judgement;Awareness;Problem solving                 Orientation Level: Disoriented to;Place ("can we go out on the porch?") Current Attention Level: Sustained Memory: Decreased short-term memory;Decreased recall of precautions Following Commands: Follows one step commands consistently (with repetition due to [facility]) Safety/Judgement: Decreased awareness of safety;Decreased awareness of deficits Awareness: Emergent Problem Solving: Difficulty sequencing;Requires verbal cues;Requires tactile cues General Comments: Unsure of baseline, seems like family took away his car, meals on wheels comes to his house to provide food and neighbors check on him daily.      General Comments General comments (skin integrity, edema, etc.): O2 sats dropped to 87% on RA during mobility, so re-applied 3L O2 [place].    Exercises     Assessment/Plan    PT Assessment Patient needs continued PT services  PT Problem List Decreased strength;Decreased activity tolerance;Decreased balance;Decreased mobility;Decreased cognition;Decreased knowledge of use of DME;Decreased safety awareness;Decreased knowledge of precautions;Obesity;Pain;Cardiopulmonary status limiting activity       PT Treatment Interventions DME instruction;Gait training;Stair training;Functional mobility training;Therapeutic activities;Therapeutic exercise;Balance training;Cognitive remediation;Patient/family education    PT Goals (Current goals can be found in the Care Plan section)  Acute Rehab PT Goals Patient Stated Goal: to not die PT Goal Formulation: With patient Time For Goal Achievement: 08/26/21 Potential to Achieve Goals: Good    Frequency Min 2X/week   Barriers to discharge         Co-evaluation               AM-PAC PT "6 Clicks" Mobility  Outcome Measure Help needed turning from your back to your side while in a flat bed without using bedrails?: A Little Help needed moving from lying on your back to sitting on the side of a flat bed without using bedrails?: A Little Help needed moving to and from a bed to a chair (including a wheelchair)?: A Little Help needed standing up from a chair using your arms (e.g., wheelchair or bedside chair)?: A Little Help needed to walk in hospital room?: A Lot Help needed climbing 3-5 steps with a railing? : Total 6 Click Score: 15    End of Session Equipment Utilized During Treatment: Oxygen Activity Tolerance: Patient limited by pain Patient left: in bed;with call bell/phone within reach Nurse Communication: Mobility status PT Visit Diagnosis: Muscle weakness (generalized) (M62.81);Difficulty in walking, not elsewhere classified (R26.2);Pain Pain - Right/Left: Right Pain - part of body:  (ribs)    Time: 6754-4920 PT Time Calculation (min) (ACUTE ONLY): 19 min   Charges:   PT Evaluation $PT Eval Moderate Complexity: 1 Mod         Corinna Capra, PT, DPT  Acute Rehabilitation Ortho Tech Supervisor [phone] pager [phone]) [phone] office

## 2021-08-12 NOTE — Progress Notes (Signed)
Received pt with four point restraints from ED.  He was very agitated and confused. Informed Dr. Ephriam Knuckles and received order for IV dilaudid and bladder scan IV lasix. Administered scheduled neb tx for expiratory wheezing with no effect.   Bladder scan showed 611 mL. Performed In/Out cath per order and collected 800 mL of urine with some blood.  UA sample was sent down.   Patient calmed down after a dose of dilaudid.  Took his ankle restraints off at 1624.  Pt currently asleep and is breathing fine without audible wheezes.  Hinton Dyer, RN

## 2021-08-12 NOTE — ED Notes (Signed)
Admitting provider at bedside.

## 2021-08-12 NOTE — ED Provider Notes (Signed)
[facility] EMERGENCY DEPARTMENT Provider Note   CSN: [id number] Arrival date & time: 08/12/21  8127  LEVEL 5 CAVEAT - DEMENTIA   History Chief Complaint  Patient presents with   Shortness of Breath    Pt arrived from [facility] by [facility], for falling 2 times in the las couple of days, now has broken ribs on the left side with infiltrates    [name] [name] is a 86 y.o. male.  HPI 86 year old male presents as a transfer from [facility] for trauma evaluation.  The patient reportedly fell.  He does not remember the fall and does have a history of Alzheimer's.  Most of the history is taken from nurse who spoke to transferring staff as well as the chart review.  Apparently the patient was found to be hypoxic to 89% and was found to have 2 rib fractures and possible hemothorax.  Patient states his pain is pretty well controlled right now.  He is not sure if he is on blood thinners.  He is not sure about any of his meds.  He is not sure how he fell.  Past Medical History:  Diagnosis Date   Arrhythmia    chronic atrial fibrillation   Arthritis    Atrial fibrillation (HCC)    chronic atrial fibrillation    CHF (congestive heart failure) (HCC)    most recent EF 50-55%   COPD (chronic obstructive pulmonary disease) (HCC)    chronic   Coronary artery disease    Depression    Dizziness    Fatigue    Heart murmur    Hernia NEC    hiatel   Hyperlipidemia    Hypertension    Reflux    Scarlet fever     Patient Active Problem List   Diagnosis Date Noted   History of aortic valve replacement 03/02/2021   Primary hypertension 03/02/2021   Mixed hyperlipidemia 03/02/2021   Nonrheumatic tricuspid valve regurgitation 03/02/2021   Nonrheumatic mitral valve regurgitation 03/02/2021   Pulmonary hypertension, unspecified (HCC) 03/02/2021   Scarlet fever    Hypertension    Heart murmur    Fatigue    Dizziness    Depression    COPD (chronic  obstructive pulmonary disease) (HCC)    Arthritis    Arrhythmia    CHF (congestive heart failure) (HCC)    Coronary artery disease    Atrial fibrillation (HCC)    Hyperlipidemia     Past Surgical History:  Procedure Laterality Date   AORTIC VALVE REPLACEMENT  2006   33-mm pericardial Edward's valve   CARDIAC CATHETERIZATION  09/13/2010   patent grafts. no significant pulmonary hypertension.    CORONARY ARTERY BYPASS GRAFT  2006   [place]       Family History  Problem Relation Age of Onset   Cancer Mother    Heart attack Father    Heart attack Brother    Cancer Brother     Social History   Tobacco Use   Smoking status: Former    Types: Cigarettes    Quit date: 12/17/1980    Years since quitting: 40.6   Smokeless tobacco: Never  Substance Use Topics   Alcohol use: No   Drug use: No    Home Medications Prior to Admission medications   Medication Sig Start Date End Date Taking? Authorizing Provider  albuterol (PROVENTIL) (2.5 MG/3ML) 0.083% nebulizer solution Inhale 2.5 mLs into the lungs every 6 (six) hours as needed for wheezing.  02/06/21   [provider]  aspirin 81 MG tablet Take 81 mg by mouth daily.    [provider]  furosemide (LASIX) 40 MG tablet Take 1 tablet (40 mg total) by mouth daily. 03/02/21   Tobb, Kardie, DO  losartan (COZAAR) 25 MG tablet Take 1 tablet (25 mg total) by mouth daily. 03/02/21   Tobb, Kardie, DO  potassium chloride (KLOR-CON) 10 MEQ CR tablet Take 20 mEq by mouth daily.    [provider]  TRELEGY ELLIPTA 100-62.5-25 MCG/INH AEPB Inhale 1 puff into the lungs daily. 02/16/21   [provider]  vitamin B-12 (CYANOCOBALAMIN) 1000 MCG tablet Take 1,000 mcg by mouth daily.    [provider]    Allergies    Patient has no known allergies.  Review of Systems   Review of Systems  Unable to perform ROS: Dementia   Physical Exam Updated Vital Signs BP 133/61   Pulse 72   Temp 98.2 F (36.8  C) (Oral)   Resp (!) 21   Ht 5\' 9"  (1.753 m)   Wt 99.8 kg   SpO2 94%   BMI 32.49 kg/m   Physical Exam Vitals and nursing note reviewed.  Constitutional:      General: He is not in acute distress.    Appearance: He is well-developed. He is not ill-appearing or diaphoretic.  HENT:     Head: Normocephalic and atraumatic.     Right Ear: External ear normal.     Left Ear: External ear normal.     Nose: Nose normal.  Eyes:     General:        Right eye: No discharge.        Left eye: No discharge.  Cardiovascular:     Rate and Rhythm: Normal rate and regular rhythm.     Heart sounds: Normal heart sounds.  Pulmonary:     Effort: Pulmonary effort is normal.     Breath sounds: Normal breath sounds.  Chest:     Chest wall: Tenderness (mild, right sided) present.  Abdominal:     Palpations: Abdomen is soft.     Tenderness: There is no abdominal tenderness.  Musculoskeletal:     Cervical back: Neck supple.  Skin:    General: Skin is warm and dry.     Comments: Small abrasion to left shoulder  Neurological:     Mental Status: He is alert.  Psychiatric:        Mood and Affect: Mood is not anxious.    ED Results / Procedures / Treatments   Labs (all labs ordered are listed, but only abnormal results are displayed) Labs Reviewed  COMPREHENSIVE METABOLIC PANEL - Abnormal; Notable for the following components:      Result Value   Sodium 134 (*)    Glucose, Bld 134 (*)    Creatinine, Ser 1.78 (*)    Calcium 8.8 (*)    Albumin 3.2 (*)    GFR, Estimated 37 (*)    All other components within normal limits  BRAIN NATRIURETIC PEPTIDE - Abnormal; Notable for the following components:   B Natriuretic Peptide 1,193.3 (*)    All other components within normal limits  CBC WITH DIFFERENTIAL/PLATELET - Abnormal; Notable for the following components:   WBC 13.3 (*)    RBC 3.76 (*)    Hemoglobin 11.0 (*)    HCT 35.0 (*)    Neutro Abs 10.8 (*)    Monocytes Absolute 1.2 (*)  Abs  Immature Granulocytes 0.08 (*)    All other components within normal limits  TROPONIN I (HIGH SENSITIVITY) - Abnormal; Notable for the following components:   Troponin I (High Sensitivity) 146 (*)    All other components within normal limits  RESP PANEL BY RT-PCR (FLU A&B, COVID) [id number]  TROPONIN I (HIGH SENSITIVITY)    EKG EKG Interpretation  Date/Time:  Saturday August 12 2021 06:39:11 EDT Ventricular Rate:  96 PR Interval:    QRS Duration: 116 QT Interval:  410 QTC Calculation: 519 R Axis:   112 Text Interpretation: Atrial fibrillation Left posterior fascicular block Borderline repolarization abnormality similar to 2015 Confirmed by Pricilla Loveless [phone]) on 08/12/2021 6:48:22 AM  Radiology DG Chest Portable 1 View  Result Date: 08/12/2021 CLINICAL DATA:  86 year old male status post fall. Right lateral rib fractures with pleural effusion versus hemothorax. EXAM: PORTABLE CHEST 1 VIEW COMPARISON:  Chest CT 0004 hours today and earlier. FINDINGS: Portable AP semi upright view at 0503 hours. Improved lung volumes from yesterday. Stable cardiomegaly and mediastinal contours. Prior CABG and cardiac valve replacement. No pneumothorax. Nondisplaced right rib fractures better demonstrated by CT. Patchy lung base opacity is stable from yesterday, without evidence of progressed small pleural effusions better demonstrated by CT. No areas of worsening ventilation. IMPRESSION: 1. No pneumothorax and stable appearance of small pleural effusions, no evidence of progressive hemothorax. 2. Nondisplaced right rib fractures better demonstrated by CT. 3. Cardiomegaly. Electronically Signed   By: Odessa Fleming M.D.   On: 08/12/2021 05:46    Procedures Procedures   Medications Ordered in ED Medications  furosemide (LASIX) injection 40 mg (has no administration in time range)  fentaNYL (SUBLIMAZE) injection 50 mcg (has no administration in time range)    ED Course  I have reviewed the triage vital signs  and the nursing notes.  Pertinent labs & imaging results that were available during my care of the patient were reviewed by me and considered in my medical decision making (see chart for details).    MDM Rules/Calculators/A&P                           Patient was transferred from [facility] for trauma evaluation.  I discussed with Dr. Bedelia Person, who will see and evaluate in the emergency department.  Unclear if trauma will admit versus a medical admission.  He does definitely have rib fractures according to imaging and while there was report of a "hemothorax" it is also likely that he just has bilateral pleural effusions from CHF given his history.  He is mildly hypoxic into the high 80s and will need oxygen and admission.  I will give some diuresis.  He has a troponin mildly elevated this probably demand from CHF.  Care transferred to Dr. Wallace Cullens Final Clinical Impression(s) / ED Diagnoses Final diagnoses:  Closed fracture of multiple ribs of right side, initial encounter  Acute on chronic congestive heart failure, unspecified heart failure type (HCC)  Respiratory failure with hypoxia, unspecified chronicity [facility])    Rx / DC Orders ED Discharge Orders     None        Pricilla Loveless, MD 08/12/21 [phone]

## 2021-08-12 NOTE — Progress Notes (Signed)
SATURATION QUALIFICATIONS: (This note is used to comply with regulatory documentation for home oxygen)  Patient Saturations on Room Air at Rest = 89%  Patient Saturations on Room Air while Ambulating = 87%  Patient Saturations on 3 Liters of oxygen while Ambulating = 94%  Please briefly explain why patient needs home oxygen: Pt desaturates on RA while moving around.  DOE 3/4 during mobility.  He currently requires 3L of supplemental O2 to maintain sats >90%.   Corinna Capra, PT, DPT  Acute Rehabilitation Ortho Tech Supervisor [phone] pager [phone] office
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
## 2021-08-12 NOTE — ED Notes (Signed)
Pt hallucinating a polar bear w/ roses around it sitting on the sink in his room.

## 2021-08-12 NOTE — ED Notes (Signed)
Pt was taken off of oxygen for 15 minutes and his oxygen fell to 89 %, placed back on 2 ltrs o2 per Dr. Criss Alvine.

## 2021-08-13 DIAGNOSIS — I509 Heart failure, unspecified: Secondary | ICD-10-CM | POA: Diagnosis not present

## 2021-08-13 DIAGNOSIS — I5023 Acute on chronic systolic (congestive) heart failure: Secondary | ICD-10-CM | POA: Diagnosis present

## 2021-08-13 DIAGNOSIS — S32010A Wedge compression fracture of first lumbar vertebra, initial encounter for closed fracture: Secondary | ICD-10-CM | POA: Diagnosis present

## 2021-08-13 DIAGNOSIS — G9341 Metabolic encephalopathy: Secondary | ICD-10-CM | POA: Diagnosis not present

## 2021-08-13 DIAGNOSIS — W19XXXA Unspecified fall, initial encounter: Secondary | ICD-10-CM | POA: Diagnosis present

## 2021-08-13 DIAGNOSIS — G9340 Encephalopathy, unspecified: Secondary | ICD-10-CM | POA: Diagnosis present

## 2021-08-13 DIAGNOSIS — S2249XA Multiple fractures of ribs, unspecified side, initial encounter for closed fracture: Secondary | ICD-10-CM | POA: Diagnosis present

## 2021-08-13 LAB — BASIC METABOLIC PANEL
Anion gap: 11 (ref 5–15)
BUN: 24 mg/dL — ABNORMAL HIGH (ref 8–23)
CO2: 29 mmol/L (ref 22–32)
Calcium: 9 mg/dL (ref 8.9–10.3)
Chloride: 98 mmol/L (ref 98–111)
Creatinine, Ser: 1.94 mg/dL — ABNORMAL HIGH (ref 0.61–1.24)
GFR, Estimated: 34 mL/min — ABNORMAL LOW (ref >60)
Glucose, Bld: 116 mg/dL — ABNORMAL HIGH (ref 70–99)
Potassium: 3.8 mmol/L (ref 3.5–5.1)
Sodium: 138 mmol/L (ref 135–145)

## 2021-08-13 LAB — CBC
HCT: 34.2 % — ABNORMAL LOW (ref 39.0–52.0)
Hemoglobin: 10.7 g/dL — ABNORMAL LOW (ref 13.0–17.0)
MCH: 28.9 pg (ref 26.0–34.0)
MCHC: 31.3 g/dL (ref 30.0–36.0)
MCV: 92.4 fL (ref 80.0–100.0)
Platelets: 198 10*3/uL (ref 150–400)
RBC: 3.7 MIL/uL — ABNORMAL LOW (ref 4.22–5.81)
RDW: 14.5 % (ref 11.5–15.5)
WBC: 11 10*3/uL — ABNORMAL HIGH (ref 4.0–10.5)
nRBC: 0 % (ref 0.0–0.2)

## 2021-08-13 LAB — VITAMIN D 25 HYDROXY (VIT D DEFICIENCY, FRACTURES): Vit D, 25-Hydroxy: 36.51 ng/mL (ref 30–100)

## 2021-08-13 MED ORDER — OXYCODONE HCL 5 MG PO TABS
5.0000 mg | ORAL_TABLET | ORAL | Status: DC | PRN
  Administered 2021-08-13 – 2021-08-17 (×10): 5 mg via ORAL
  Filled 2021-08-13 (×10): qty 1

## 2021-08-13 MED ORDER — FUROSEMIDE 10 MG/ML IJ SOLN: 40.0000 mg | Freq: Once | INTRAMUSCULAR | Status: DC

## 2021-08-13 MED ORDER — IPRATROPIUM-ALBUTEROL 0.5-2.5 (3) MG/3ML IN SOLN
3.0000 mL | Freq: Three times a day (TID) | RESPIRATORY_TRACT | Status: DC
  Administered 2021-08-13 – 2021-08-15 (×5): 3 mL via RESPIRATORY_TRACT
  Filled 2021-08-13 (×5): qty 3

## 2021-08-13 MED ORDER — FUROSEMIDE 10 MG/ML IJ SOLN
60.0000 mg | Freq: Once | INTRAMUSCULAR | Status: AC
  Administered 2021-08-13: 60 mg via INTRAVENOUS
  Filled 2021-08-13: qty 6

## 2021-08-13 MED ORDER — MENTHOL 3 MG MT LOZG
1.0000 | LOZENGE | OROMUCOSAL | Status: DC | PRN
  Administered 2021-08-13: 3 mg via ORAL
  Filled 2021-08-13: qty 9

## 2021-08-13 MED ORDER — SODIUM CHLORIDE 0.9 % IR SOLN: 30.0000 mL | Status: DC | PRN

## 2021-08-13 MED ORDER — HYDROMORPHONE HCL 1 MG/ML IJ SOLN
1.0000 mg | Freq: Once | INTRAMUSCULAR | Status: AC
Start: 2021-08-13 — End: 2021-08-13
  Administered 2021-08-13: 1 mg via INTRAVENOUS
  Filled 2021-08-13: qty 1

## 2021-08-13 MED ORDER — CHLORHEXIDINE GLUCONATE CLOTH 2 % EX PADS
6.0000 | MEDICATED_PAD | Freq: Every day | CUTANEOUS | Status: DC
  Administered 2021-08-13 – 2021-08-15 (×3): 6 via TOPICAL

## 2021-08-13 NOTE — Progress Notes (Signed)
[name] [name] is 86 year old male with past medical history of A fib, CHF, COPD, CAD, HLD, HTN, GERD, and depression that presented from OSH after a fall that resulted in multiple rib fractures and spinal fracture on hospital day 1.  Subjective:  Patient still altered and agitated this morning. He denied any symptoms. Stated he needed to go home.   Overnight:No acute events overnight.   Objective:  Vital signs in last 24 hours: Vitals:   08/12/21 2329 08/12/21 2352 08/13/21 0327 08/13/21 0407  BP:  (!) 106/46  (!) 131/58  Pulse:  87  92  Resp:  15  19  Temp:  98.2 F (36.8 C)  98 F (36.7 C)  TempSrc:  Axillary  Axillary  SpO2: 97% 96% 96% 98%  Weight:    103.5 kg  Height:       Physical Exam General: NAD, comfortable, obese; in restraints CV: regular rate, irregular rhythm, 2+ pulses Lungs: bibasilar crackles on anterior ausculation  Abdomen: TTP to right abdomen; normal bowel sounds MSK: Trace edema Neuro: Alert, oriented to person, but not place, time or situation  Assessment/Plan:  Active Problems:   CHF exacerbation (HCC)  Acute metabolic encephalopathy Patient altered beyond baseline. CBC improving w/o intervention and no left shift so infection is less likely. Patient with sporadic use of inhalers with COPD requiring 2 L oxygen inpatient could have encephalopathy 2/2 to hypoxia. Could also be secondary to urinary retention as patient is requiring a foley.    Plan: -Control pain and continue to monitor -Foley cath as patient has urinary retention -IV Lasix prn to offload volume.    Rib fracture/spinal fracture 2/2 Fall Patient has multiple rib fractures with imaging showing non-displaced right lateral 7th and mildly displaced right lateral 6th rib fractures. It also shows L1 compression fracture.   Plan: -NSG consulted for spinal fracture by trauma surgery -Trauma surgery recommends pain control, pulmonary toilet, incentive spirometry   Atrial  fibrillation Patient has past medical history of a-fib but is not on anticoagulation per family's preferences as he has frequent falls. No on beta blocker due to family preference as well.    Plan: -Continue to monitor as HR stays <100.   Acute on chronic systolicCHF Patient has CHF with EF of 30-35% with severe dilation of left atrium. Currently on home lasix 40 mg daily. His BNP was 1,193 on 08/12/21.   Plan: -IV lasix until euvolemic  -IV lasix 60 mg 08/13/21 -Hold home lasix until more euvolemic   COPD Patient has COPD with extensive smoking history. He has not been using his inhalers regularly per the daughter.    Plan: -Duonebs q 4hrs prn -Breo and Incruse   Hypertension: Patient has hypertension on Losartan 25 mg. He was hypertensive in the ED with 161/63. May be hypertensive due to pain 2/2 to multiple fractures.   Plan: -Continue home Losartan 25 mg daily -Pain control -Continue to monitor    Hematuria: Had RBCs on last UA done after straight cath.   Plan: -Continue to monitor -Would repeat after foley to see if it persists.       DVT prophx: Lovenox Diet:Cardiac Bowel:PRN Code:DNR   Prior to Admission Living Arrangement:Home Anticipated Discharge Location:SNF Barriers to Discharge:Medical Clearance   Dispo: Admit patient to Inpatient with expected length of stay greater than 2 midnights.   Signed: Gwenevere Abbot, MD 08/12/2021, 10:39 AM  Pager: [phone] After 5 and on weekends: Call on-call pager

## 2021-08-13 NOTE — Progress Notes (Signed)
Subjective: CC: Agitated overnight. Thinks he is at home but is otherwise orientated. Currently in 4 point restratints. Reviewed OHS images and did have CTH that was read on 8/27 and negative for acute abnormality. Reports pain over ribs. Some sob. No other complaints. Had to get I/O'd yesterday. Pt rec snf. NSGY eval pending.   Objective: Vital signs in last 24 hours: Temp:  [97.9 F (36.6 C)-98.4 F (36.9 C)] 97.9 F (36.6 C) (08/28 0856) Pulse Rate:  [61-104] 90 (08/28 0856) Resp:  [11-31] 23 (08/28 0856) BP: (99-162)/(44-122) 135/61 (08/28 0856) SpO2:  [96 %-100 %] 100 % (08/28 0856) Weight:  [103.5 kg-104.1 kg] 103.5 kg (08/28 0407) Last BM Date:  (unable to obtain)  Intake/Output from previous day: 08/27 0701 - 08/28 0700 In: 240 [P.O.:240] Out: 1750 [Urine:1750] Intake/Output this shift: No intake/output data recorded.  PE: Gen:  Alert, NAD HEENT: EOM's intact, pupils equal and round Card:  Reg Pulm:  CTAB, no W/R/R, effort normal Abd: Soft, ND, NT +BS, umbilical hernia reducible  Ext: Moves all extremities. Limited by restraints. No reported pain. No LE edema Psych: A&Ox3  Skin: no rashes noted, warm and dry  Lab Results:  Recent Labs    08/12/21 0457 08/13/21 0445  WBC 13.3* 11.0*  HGB 11.0* 10.7*  HCT 35.0* 34.2*  PLT 215 198   BMET Recent Labs    08/12/21 0457 08/13/21 0445  NA 134* 138  K 4.3 3.8  CL 100 98  CO2 25 29  GLUCOSE 134* 116*  BUN 23 24*  CREATININE 1.78* 1.94*  CALCIUM 8.8* 9.0   PT/INR No results for input(s): LABPROT, INR in the last 72 hours. CMP     Component Value Date/Time   NA 138 08/13/2021 0445   NA 137 03/02/2021 1151   K 3.8 08/13/2021 0445   CL 98 08/13/2021 0445   CO2 29 08/13/2021 0445   GLUCOSE 116 (H) 08/13/2021 0445   BUN 24 (H) 08/13/2021 0445   BUN 16 03/02/2021 1151   CREATININE 1.94 (H) 08/13/2021 0445   CALCIUM 9.0 08/13/2021 0445   PROT 6.5 08/12/2021 0457   ALBUMIN 3.2 (L) 08/12/2021  0457   AST 38 08/12/2021 0457   ALT 18 08/12/2021 0457   ALKPHOS 46 08/12/2021 0457   BILITOT 1.1 08/12/2021 0457   GFRNONAA 34 (L) 08/13/2021 0445   GFRAA 46 (L) 05/02/2014 0315   Lipase  No results found for: LIPASE  Studies/Results: CT ABDOMEN PELVIS WO CONTRAST  Result Date: 08/12/2021 CLINICAL DATA:  86 year old male status post fall. Right lateral rib fractures with pleural effusion versus hemothorax. EXAM: CT ABDOMEN AND PELVIS WITHOUT CONTRAST TECHNIQUE: Multidetector CT imaging of the abdomen and pelvis was performed following the standard protocol without IV contrast. COMPARISON:  [facility] CT chest 0004 hours today. Portable chest 0503 hours today. FINDINGS: Lower chest: Cardiomegaly redemonstrated. No pericardial effusion. Allowing for motion artifact on the earlier CT today, layering small pleural effusions are stable, and that on the right has simple fluid density arguing in favor of transudate rather than hemothorax. Confluent but stable lung base atelectasis. No pneumothorax at the lung bases. Hepatobiliary: Mild motion artifact. Grossly negative noncontrast liver and gallbladder. Pancreas: Atrophied. Spleen: Diminutive, negative. Adrenals/Urinary Tract: Normal adrenal glands. Nonobstructed kidneys. Renal vascular calcifications. There are several small round isodense exophytic renal lesions, such as the 16 mm lesion at the right lower pole on coronal image 66. These are probably benign proteinaceous cysts. Decompressed ureters.  Unremarkable bladder. Stomach/Bowel: Redundant large bowel. Extensive diverticulosis from the hepatic flexure to the sigmoid colon. No active inflammation. Negative appendix on series 4, image 67. Decompressed and negative terminal ileum. No dilated small bowel. Round 5 cm umbilical hernia contains fat on series 4, image 59. Negative stomach and duodenum. No free air or free fluid. Vascular/Lymphatic: Extensive Aortoiliac calcified atherosclerosis.  Normal caliber abdominal aorta. No lymphadenopathy. Reproductive: Prostatomegaly. Small fat containing right inguinal hernia. Other: No pelvic free fluid. Musculoskeletal: Prior sternotomy. Nondisplaced right lateral 7th and mildly displaced right lateral 6th rib fractures. The remaining lower ribs appear to be intact. Mild to moderate L1 compression fracture however appears likely to be acute with unhealed superior endplate lucency. 35% loss of vertebral body height. No retropulsion. The T12 posterior elements appear intact. Other visible lower thoracic and lumbar vertebrae appear intact. Sacrum, SI joints, pelvis and proximal femurs appear intact. IMPRESSION: 1. Right lateral 6th and 7th rib fractures redemonstrated. Small layering pleural effusions right > left are stable since 0004 hours today, and simple fluid density argues in favor of transudate and against hemothorax. 2. Acute appearing L1 compression fracture with 35% loss of vertebral body height. No retropulsion or other complicating features. 3. No other acute traumatic injury identified in the non-contrast abdomen or pelvis. 4. Cardiomegaly. Large bowel diverticulosis. Prostatomegaly. Aortic Atherosclerosis (ICD10-I70.0). Electronically Signed   By: Odessa Fleming M.D.   On: 08/12/2021 07:48   DG Chest Portable 1 View  Result Date: 08/12/2021 CLINICAL DATA:  86 year old male status post fall. Right lateral rib fractures with pleural effusion versus hemothorax. EXAM: PORTABLE CHEST 1 VIEW COMPARISON:  Chest CT 0004 hours today and earlier. FINDINGS: Portable AP semi upright view at 0503 hours. Improved lung volumes from yesterday. Stable cardiomegaly and mediastinal contours. Prior CABG and cardiac valve replacement. No pneumothorax. Nondisplaced right rib fractures better demonstrated by CT. Patchy lung base opacity is stable from yesterday, without evidence of progressed small pleural effusions better demonstrated by CT. No areas of worsening ventilation.  IMPRESSION: 1. No pneumothorax and stable appearance of small pleural effusions, no evidence of progressive hemothorax. 2. Nondisplaced right rib fractures better demonstrated by CT. 3. Cardiomegaly. Electronically Signed   By: Odessa Fleming M.D.   On: 08/12/2021 05:46    Anti-infectives: Anti-infectives (From admission, onward)    None        Assessment/Plan 10M GLF Rib fx - pain control, pulm toilet/IS Pleural effusions, b/l - likely related to underlying chronic illness and not hemothorax L1 compression fracture - NSGY c/s, notified at 0751 8/27 per notes. Consult pending.  Umbilical hernia - fat containing on CT. Reducible  MMP - per primary  FEN - okay for regular diet from trauma standpoint ID - None currently.  Foley - I/O'd overnight. Monitor for retention DVT - recommend DVT prophylaxis with LMWH 40mg  BID as long as GFR>30 Dispo - Awaiting NSGY eval. Cont pain control and pulm toilet. We will be available as needed moving forward.    LOS: 1 day    , [facility] 08/13/2021, 10:49 AM Please see Amion for pager number during day hours 7:00am-4:30pm

## 2021-08-14 DIAGNOSIS — I509 Heart failure, unspecified: Secondary | ICD-10-CM | POA: Diagnosis not present

## 2021-08-14 DIAGNOSIS — G9341 Metabolic encephalopathy: Secondary | ICD-10-CM | POA: Diagnosis not present

## 2021-08-14 LAB — CBC WITH DIFFERENTIAL/PLATELET
Abs Immature Granulocytes: 0.06 10*3/uL (ref 0.00–0.07)
Basophils Absolute: 0 10*3/uL (ref 0.0–0.1)
Basophils Relative: 0 %
Eosinophils Absolute: 0.2 10*3/uL (ref 0.0–0.5)
Eosinophils Relative: 2 %
HCT: 35.8 % — ABNORMAL LOW (ref 39.0–52.0)
Hemoglobin: 11.2 g/dL — ABNORMAL LOW (ref 13.0–17.0)
Immature Granulocytes: 1 %
Lymphocytes Relative: 11 %
Lymphs Abs: 1.5 10*3/uL (ref 0.7–4.0)
MCH: 29 pg (ref 26.0–34.0)
MCHC: 31.3 g/dL (ref 30.0–36.0)
MCV: 92.7 fL (ref 80.0–100.0)
Monocytes Absolute: 2 10*3/uL — ABNORMAL HIGH (ref 0.1–1.0)
Monocytes Relative: 15 %
Neutro Abs: 9.4 10*3/uL — ABNORMAL HIGH (ref 1.7–7.7)
Neutrophils Relative %: 71 %
Platelets: 193 10*3/uL (ref 150–400)
RBC: 3.86 MIL/uL — ABNORMAL LOW (ref 4.22–5.81)
RDW: 14.2 % (ref 11.5–15.5)
WBC: 13.2 10*3/uL — ABNORMAL HIGH (ref 4.0–10.5)
nRBC: 0 % (ref 0.0–0.2)

## 2021-08-14 LAB — BASIC METABOLIC PANEL
Anion gap: 10 (ref 5–15)
BUN: 27 mg/dL — ABNORMAL HIGH (ref 8–23)
CO2: 30 mmol/L (ref 22–32)
Calcium: 8.6 mg/dL — ABNORMAL LOW (ref 8.9–10.3)
Chloride: 96 mmol/L — ABNORMAL LOW (ref 98–111)
Creatinine, Ser: 1.94 mg/dL — ABNORMAL HIGH (ref 0.61–1.24)
GFR, Estimated: 34 mL/min — ABNORMAL LOW (ref >60)
Glucose, Bld: 98 mg/dL (ref 70–99)
Potassium: 3.7 mmol/L (ref 3.5–5.1)
Sodium: 136 mmol/L (ref 135–145)

## 2021-08-14 MED ORDER — FUROSEMIDE 40 MG PO TABS
80.0000 mg | ORAL_TABLET | Freq: Once | ORAL | Status: AC
  Administered 2021-08-14: 80 mg via ORAL
  Filled 2021-08-14: qty 2

## 2021-08-14 MED ORDER — FUROSEMIDE 40 MG PO TABS
40.0000 mg | ORAL_TABLET | Freq: Every day | ORAL | Status: DC
  Administered 2021-08-15 – 2021-08-18 (×3): 40 mg via ORAL
  Filled 2021-08-14 (×4): qty 1

## 2021-08-14 NOTE — TOC Initial Note (Signed)
Transition of Care [facility]) - Initial/Assessment Note    Patient Details  Name: [name] [name] MRN: [id number] Date of Birth: 03-Jan-1935  [facility] CM/SW Contact:    Terrial Rhodes, LCSWA Phone Number: 08/14/2021, 11:25 AM  Clinical Narrative:                  CSW left voicemail with patients daughter Maud Deed to discuss possible SNF placement. CSW awaiting callback. CSW will continue to follow and assist with dc planning needs.       Patient Goals and CMS Choice        Expected Discharge Plan and Services                                                Prior Living Arrangements/Services                       Activities of Daily Living      Permission Sought/Granted                  Emotional Assessment              Admission diagnosis:  CHF exacerbation (HCC) [I50.9] Closed fracture of multiple ribs of right side, initial encounter [S22.41XA] Respiratory failure with hypoxia, unspecified chronicity (HCC) [J96.91] Acute on chronic congestive heart failure, unspecified heart failure type (HCC) [I50.9] Patient Active Problem List   Diagnosis Date Noted   Fracture of multiple ribs 08/13/2021   Closed compression fracture of body of L1 vertebra (HCC) 08/13/2021   Fall 08/13/2021   Acute on chronic systolic heart failure (HCC) 08/13/2021   Acute encephalopathy 08/13/2021   CHF exacerbation (HCC) 08/12/2021   History of aortic valve replacement 03/02/2021   Primary hypertension 03/02/2021   Mixed hyperlipidemia 03/02/2021   Nonrheumatic tricuspid valve regurgitation 03/02/2021   Nonrheumatic mitral valve regurgitation 03/02/2021   Pulmonary hypertension, unspecified (HCC) 03/02/2021   Scarlet fever    Hypertension    Heart murmur    Fatigue    Dizziness    Depression    COPD (chronic obstructive pulmonary disease) (HCC)    Arthritis    Arrhythmia    CHF (congestive heart failure) (HCC)    Coronary artery disease     Atrial fibrillation (HCC)    Hyperlipidemia    PCP:  Dema Severin, NP Pharmacy:   [facility] [address], [place] - [address] [facility] [place] [address] Phone: [phone] Fax: [phone]     Social Determinants of Health (SDOH) Interventions    Readmission Risk Interventions No flowsheet data found.

## 2021-08-14 NOTE — TOC Initial Note (Signed)
Transition of Care [facility]) - Initial/Assessment Note    Patient Details  Name: [name] [name] MRN: [id number] Date of Birth: 05/08/1935  Transition of Care [facility]) CM/SW Contact:    Terrial Rhodes, LCSWA Phone Number: 08/14/2021, 12:01 PM  Clinical Narrative:                   CSW received consult for possible SNF placement at time of discharge. CSW spoke with patients daughter Velna Hatchet regarding PT recommendation of SNF placement at time of discharge. Patients daughter reports patient comes from home alone.Patients daughter expressed understanding of PT recommendation and is agreeable to SNF placement for patient at time of discharge. Patients daughter gave CSW permission to fax out initial referral near the [facility] and [address] area. Patients daughter also gave CSW permission to fax out initial referral to [facility] in [place] and [place].Patient has not received the COVID vaccines.  No further questions reported at this time. CSW to continue to follow and assist with discharge planning needs.   Expected Discharge Plan: Skilled Nursing Facility Barriers to Discharge: Continued Medical Work up   Patient Goals and CMS Choice   CMS Medicare.gov Compare Post Acute Care list provided to:: Patient Represenative (must comment) (patients daughter Maud Deed) Choice offered to / list presented to : Adult Children (patients daughter Maud Deed)  Expected Discharge Plan and Services Expected Discharge Plan: Skilled Nursing Facility In-house Referral: Clinical Social Work     Living arrangements for the past 2 months: Single Family Home                                      Prior Living Arrangements/Services Living arrangements for the past 2 months: Single Family Home Lives with:: Self Patient language and need for interpreter reviewed:: Yes Do you feel safe going back to the place where you live?: No   SNF  Need for Family Participation in Patient Care: Yes (Comment) Care giver support  system in place?: Yes (comment)   Criminal Activity/Legal Involvement Pertinent to Current Situation/Hospitalization: No - Comment as needed  Activities of Daily Living      Permission Sought/Granted Permission sought to share information with : Case Manager, Family Supports, Magazine features editor Permission granted to share information with : No  Share Information with NAME: Patients only oriented to person CSW spoke with daughter Maud Deed  Permission granted to share info w AGENCY: Patients only oriented to person CSW spoke with daughter Maud Deed  / SNF  Permission granted to share info w Relationship: Patients only oriented to person CSW spoke with daughter Maud Deed  Permission granted to share info w Contact Information: Patients only oriented to person CSW spoke with daughter Maud Deed   [phone]  Emotional Assessment       Orientation: : Oriented to Self Alcohol / Substance Use: Not Applicable Psych Involvement: No (comment)  Admission diagnosis:  CHF exacerbation (HCC) [I50.9] Closed fracture of multiple ribs of right side, initial encounter [S22.41XA] Respiratory failure with hypoxia, unspecified chronicity (HCC) [J96.91] Acute on chronic congestive heart failure, unspecified heart failure type (HCC) [I50.9] Patient Active Problem List   Diagnosis Date Noted   Fracture of multiple ribs 08/13/2021   Closed compression fracture of body of L1 vertebra (HCC) 08/13/2021   Fall 08/13/2021   Acute on chronic systolic heart failure (HCC) 08/13/2021   Acute encephalopathy 08/13/2021   CHF exacerbation (HCC) 08/12/2021   History of aortic  valve replacement 03/02/2021   Primary hypertension 03/02/2021   Mixed hyperlipidemia 03/02/2021   Nonrheumatic tricuspid valve regurgitation 03/02/2021   Nonrheumatic mitral valve regurgitation 03/02/2021   Pulmonary hypertension, unspecified (HCC) 03/02/2021   Scarlet fever    Hypertension    Heart murmur    Fatigue    Dizziness     Depression    COPD (chronic obstructive pulmonary disease) (HCC)    Arthritis    Arrhythmia    CHF (congestive heart failure) (HCC)    Coronary artery disease    Atrial fibrillation (HCC)    Hyperlipidemia    PCP:  Dema Severin, NP Pharmacy:   [facility] [address] [place] - [address] [facility] [place] [address] Phone: [phone] Fax: [phone]     Social Determinants of Health (SDOH) Interventions    Readmission Risk Interventions No flowsheet data found.

## 2021-08-14 NOTE — NC FL2 (Signed)
[place] MEDICAID FL2 LEVEL OF CARE SCREENING TOOL     IDENTIFICATION  Patient Name: [name] [name] Birthdate: 23-Aug-1935 Sex: male Admission Date (Current Location): 08/12/2021  [facility] and [place] Number:  Producer, television/film/video and Address:  [facility] [place], [address], [place], [place] [address]      Provider Number: [id number]  Attending Physician Name and Address:  Gust Rung, DO  Relative Name and Phone Number:  Maud Deed [phone]    Current Level of Care: Hospital Recommended Level of Care: Skilled Nursing Facility Prior Approval Number:    Date Approved/Denied:   PASRR Number: [id number]  Discharge Plan: SNF    Current Diagnoses: Patient Active Problem List   Diagnosis Date Noted   Fracture of multiple ribs 08/13/2021   Closed compression fracture of body of L1 vertebra (HCC) 08/13/2021   Fall 08/13/2021   Acute on chronic systolic heart failure (HCC) 08/13/2021   Acute encephalopathy 08/13/2021   CHF exacerbation (HCC) 08/12/2021   History of aortic valve replacement 03/02/2021   Primary hypertension 03/02/2021   Mixed hyperlipidemia 03/02/2021   Nonrheumatic tricuspid valve regurgitation 03/02/2021   Nonrheumatic mitral valve regurgitation 03/02/2021   Pulmonary hypertension, unspecified (HCC) 03/02/2021   Scarlet fever    Hypertension    Heart murmur    Fatigue    Dizziness    Depression    COPD (chronic obstructive pulmonary disease) (HCC)    Arthritis    Arrhythmia    CHF (congestive heart failure) (HCC)    Coronary artery disease    Atrial fibrillation (HCC)    Hyperlipidemia     Orientation RESPIRATION BLADDER Height & Weight     Self  O2 (Nasal Cannula 3 liters) Continent (Urethral Catheter non-latex 16 Fri.) Weight: 226 lb 6.6 oz (102.7 kg) Height:  5\' 9"  (175.3 cm)  BEHAVIORAL SYMPTOMS/MOOD NEUROLOGICAL BOWEL NUTRITION STATUS        Diet (Please See Dischage Summary)  AMBULATORY STATUS COMMUNICATION  OF NEEDS Skin   Limited Assist Verbally Other (Comment) (Rash Groin Bilateral wound/incision open or dehiced (MASD) Groin Right,Left, Rash)                       Personal Care Assistance Level of Assistance  Bathing, Feeding, Dressing Bathing Assistance: Limited assistance Feeding assistance: Limited assistance (Needs assist) Dressing Assistance: Limited assistance     Functional Limitations Info  Sight, Hearing, Speech Sight Info: Adequate Hearing Info: Impaired Speech Info: Adequate    SPECIAL CARE FACTORS FREQUENCY  PT (By licensed PT), OT (By licensed OT)     PT Frequency: 5x  min weekly OT Frequency: 5x min weekly            Contractures Contractures Info: Not present    Additional Factors Info  Code Status, Allergies Code Status Info: DNR Allergies Info: No Known Allergies           Current Medications (08/14/2021):  This is the current hospital active medication list Current Facility-Administered Medications  Medication Dose Route Frequency Provider Last Rate Last Admin   0.9 %  sodium chloride infusion  250 mL Intravenous PRN Christian, Rylee, MD       acetaminophen (TYLENOL) tablet 1,000 mg  1,000 mg Oral Q8H Christian, Rylee, MD   1,000 mg at 08/13/21 2100   aspirin EC tablet 81 mg  81 mg Oral Daily Christian, Rylee, MD   81 mg at 08/14/21 1006   Chlorhexidine Gluconate Cloth 2 %  PADS 6 each  6 each Topical Daily Gust Rung, DO   6 each at 08/14/21 1018   enoxaparin (LOVENOX) injection 40 mg  40 mg Subcutaneous Q24H Christian, Rylee, MD   40 mg at 08/14/21 1159   fluticasone furoate-vilanterol (BREO ELLIPTA) 100-25 MCG/INH 1 puff  1 puff Inhalation Daily Christian, Rylee, MD   1 puff at 08/14/21 0731   ipratropium-albuterol (DUONEB) 0.5-2.5 (3) MG/3ML nebulizer solution 3 mL  3 mL Nebulization TID Carlynn Purl C, DO   3 mL at 08/14/21 0730   losartan (COZAAR) tablet 25 mg  25 mg Oral Daily Christian, Rylee, MD   25 mg at 08/14/21 1006    menthol-cetylpyridinium (CEPACOL) lozenge 3 mg  1 lozenge Oral PRN Elige Radon, MD   3 mg at 08/13/21 1520   methocarbamol (ROBAXIN) tablet 1,000 mg  1,000 mg Oral Q8H Christian, Rylee, MD   1,000 mg at 08/13/21 2100   morphine 2 MG/ML injection 1-2 mg  1-2 mg Intravenous Q2H PRN Elige Radon, MD   2 mg at 08/14/21 0522   oxyCODONE (Oxy IR/ROXICODONE) immediate release tablet 5 mg  5 mg Oral Q4H PRN Ephriam Knuckles, Rylee, MD   5 mg at 08/14/21 1158   sodium chloride flush (NS) 0.9 % injection 3 mL  3 mL Intravenous Q12H Christian, Rylee, MD   3 mL at 08/14/21 1019   sodium chloride flush (NS) 0.9 % injection 3 mL  3 mL Intravenous PRN Christian, Rylee, MD       sodium chloride irrigation 0.9 % 30 mL  30 mL Bladder Irrigation PRN Christian, Rylee, MD       tamsulosin (FLOMAX) capsule 0.4 mg  0.4 mg Oral Daily Christian, Rylee, MD   0.4 mg at 08/14/21 1006   umeclidinium bromide (INCRUSE ELLIPTA) 62.5 MCG/INH 1 puff  1 puff Inhalation Daily Elige Radon, MD   1 puff at 08/14/21 1497     Discharge Medications: Please see discharge summary for a list of discharge medications.  Relevant Imaging Results:  Relevant Lab Results:   Additional Information [id number]  Terrial Rhodes, LCSWA

## 2021-08-14 NOTE — Progress Notes (Signed)
[name] [name] is 86 year old male with past medical history of A fib, CHF, COPD, CAD, HLD, HTN, GERD, and depression that presented from OSH after a fall that resulted in multiple rib fractures and spinal fracture on hospital day 2.  Subjective:  Patient denies any pain at rest but pain with movement and palpation still present. Denies shortness of breath or chest pain. He appeared less agitated and more oriented today.    Overnight:No acute events overnight.   Objective:  Vital signs in last 24 hours: Vitals:   08/13/21 2104 08/13/21 2336 08/14/21 0505 08/14/21 0717  BP: (!) 105/56 (!) 119/52 (!) 134/45 (!) 106/57  Pulse: 79  89 82  Resp: 14 15 17 19   Temp: 98.2 F (36.8 C)  98.4 F (36.9 C) 98.3 F (36.8 C)  TempSrc:   Oral Oral  SpO2:  94% 93% 100%  Weight:   102.7 kg   Height:       Physical Exam General: NAD, comfortable, obese; in restraints CV: regular rate, irregular rhythm, 2+ pulses Lungs: bibasilar crackles on anterior ausculation, wheeze present as well Abdomen: TTP to right abdomen; normal bowel sounds MSK: Trace edema Neuro: Alert, oriented to person, place, and situation.   Active Problems:   Atrial fibrillation (HCC)   CHF exacerbation (HCC)   Fracture of multiple ribs   Closed compression fracture of body of L1 vertebra (HCC)   Fall   Acute on chronic systolic heart failure (HCC)   Acute encephalopathy CBC Latest Ref Rng & Units 08/14/2021 08/13/2021 08/12/2021  WBC 4.0 - 10.5 K/uL 13.2(H) 11.0(H) 13.3(H)  Hemoglobin 13.0 - 17.0 g/dL 11.2(L) 10.7(L) 11.0(L)  Hematocrit 39.0 - 52.0 % 35.8(L) 34.2(L) 35.0(L)  Platelets 150 - 400 K/uL 193 198 215    BMP Latest Ref Rng & Units 08/14/2021 08/13/2021 08/12/2021  Glucose 70 - 99 mg/dL 98 08/14/2021) 409(W)  BUN 8 - 23 mg/dL 119(J) 47(W) 23  Creatinine 0.61 - 1.24 mg/dL 29(F) 6.21(H) 0.86(V)  BUN/Creat Ratio 10 - 24 - - -  Sodium 135 - 145 mmol/L 136 138 134(L)  Potassium 3.5 - 5.1 mmol/L 3.7 3.8 4.3  Chloride  98 - 111 mmol/L 96(L) 98 100  CO2 22 - 32 mmol/L 30 29 25   Calcium 8.9 - 10.3 mg/dL 7.84(O) 9.0 )    Acute metabolic encephalopathy Patient altered beyond baseline. CBC improving w/o intervention and no left shift so infection is less likely. Patient with sporadic use of inhalers with COPD requiring 2 L oxygen inpatient could have encephalopathy 2/2 to hypoxia. Could also be secondary to urinary retention as patient is requiring a foley. Will plan to remove restraints if patient continues to be alert and oriented without agitation.    Plan: -Control pain and continue to monitor -Foley cath as patient has urinary retention  -urine output of 2.09 L and net negative of -2.06 L. -PO lasix 80 mg 08/14/21   Rib fracture/spinal fracture 2/2 Fall Patient has multiple rib fractures with imaging showing non-displaced right lateral 7th and mildly displaced right lateral 6th rib fractures. It also shows L1 compression fracture.   Plan: -NSG consulted for spinal fracture by trauma surgery -Trauma surgery recommends pain control, pulmonary toilet, incentive spirometry   Atrial fibrillation Patient has past medical history of a-fib but is not on anticoagulation per family's preferences as he has frequent falls. No on beta blocker due to family preference as well.    Plan: -Continue to monitor as HR stays <100.  Acute on chronic systolicCHF Patient has CHF with EF of 30-35% with severe dilation of left atrium. Currently on home lasix 40 mg daily. His BNP was 1,193 on 08/12/21.   Plan: -IV lasix 08/13/21 -PO lasix 80 mg 08/14/21 and then home dose of 40 mg starting 08/15/21   -urine output of 2.09 L and net negative of -2.06 L.  COPD Patient has COPD with extensive smoking history. He has not been using his inhalers regularly per the daughter. Wheezing this am.   Plan: -Duonebs q 4hrs prn -Breo and Incruse   Hypertension: Patient has hypertension on Losartan 25 mg. Currently 120/52. May  get hypertensive due to pain 2/2 to multiple fractures.   Plan: -Continue home Losartan 25 mg daily -Pain control -Continue to monitor    Hematuria: Had RBCs on last UA done after straight cath. Forcefully removed catheter yesterday and has more hematuria this am.   Plan: -Continue to monitor -Would repeat outpatient after it heals      DVT prophx: Lovenox Diet:Cardiac Bowel:PRN Code:DNR   Prior to Admission Living Arrangement:Home Anticipated Discharge Location:SNF Barriers to Discharge:Medical Clearance   Dispo: Admit patient to Inpatient with expected length of stay greater than 2 midnights.   Signed: Gwenevere Abbot, MD 08/12/2021, 10:39 AM  Pager: [phone] After 5 and on weekends: Call on-call pager

## 2021-08-14 NOTE — Progress Notes (Signed)
Heart Failure Nurse Navigator Progress Note  Following hospitalization for potential HV TOC readiness. Pt currently agitated and in 4-point restraints. Will continue to monitor and assess patient closer to DC day for appropriateness.  Ozella Rocks, MSN, RN Heart Failure Nurse Navigator [phone]

## 2021-08-14 NOTE — Progress Notes (Signed)
Physical Therapy Treatment Patient Details Name: [name] [name] MRN: [id number] DOB: [birth year] Today's Date: 08/14/2021    History of Present Illness 86 y.o. male admitted on 08/12/21 after a ground level fall sustaining R lateral 6th and 7th rib fxs, acute L1 compression fx (NSYG consult pending), bil pleural effusions.  Pt with significant PMH of chronic A fib, CHF, COPD, CAD, dizziness, HTN, AVR, CABG.    PT Comments    Patient received in bed, agreeable to PT, he is pleasant. He requires min +2 assist for bed mobility. Mod assist +2 for sit to stand and to take a few steps from bed to recliner. Patient limited by pain and weakness. He will continue to benefit from skilled PT while here to improve strength and functional mobility.     Follow Up Recommendations  SNF;Supervision for mobility/OOB     Equipment Recommendations  Other (comment) (TBD)    Recommendations for Other Services       Precautions / Restrictions Precautions Precautions: Fall Restrictions Weight Bearing Restrictions: No    Mobility  Bed Mobility Overal bed mobility: Needs Assistance Bed Mobility: Supine to Sit     Supine to sit: Min assist          Transfers Overall transfer level: Needs assistance Equipment used: 2 person hand held assist Transfers: Sit to/from Stand Sit to Stand: Mod assist;From elevated surface         General transfer comment: mod assist for sit to stand from elevated bed.  Ambulation/Gait Ambulation/Gait assistance: Mod assist;+2 physical assistance Gait Distance (Feet): 3 Feet Assistive device: 2 person hand held assist Gait Pattern/deviations: Step-to pattern;Decreased step length - right;Decreased step length - left;Leaning posteriorly Gait velocity: decr   General Gait Details: patient able to take a few steps from bed to recliner. Posterior lean, increased time needed.   Stairs             Wheelchair Mobility    Modified Rankin (Stroke Patients  Only)       Balance Overall balance assessment: Needs assistance Sitting-balance support: Feet supported Sitting balance-Leahy Scale: Fair     Standing balance support: Bilateral upper extremity supported;During functional activity Standing balance-Leahy Scale: Poor Standing balance comment: requires B UE support, assistance for balance                            Cognition Arousal/Alertness: Awake/alert;Lethargic Behavior During Therapy: WFL for tasks assessed/performed Overall Cognitive Status: No family/caregiver present to determine baseline cognitive functioning Area of Impairment: Awareness                 Orientation Level: Disoriented to;Situation Current Attention Level: Sustained   Following Commands: Follows one step commands consistently;Follows one step commands with increased time Safety/Judgement: Decreased awareness of deficits;Decreased awareness of safety Awareness: Emergent Problem Solving: Difficulty sequencing;Requires verbal cues;Requires tactile cues General Comments: Unsure of baseline, seems like family took away his car, meals on wheels comes to his house to provide food and neighbors check on him daily.      Exercises      General Comments        Pertinent Vitals/Pain Pain Assessment: Faces Faces Pain Scale: Hurts little more Pain Location: ribs when breathing and coughing, pain all over. Pain Descriptors / Indicators: Guarding;Grimacing;Discomfort Pain Intervention(s): Monitored during session;Limited activity within patient's tolerance;Repositioned    Home Living  Prior Function            PT Goals (current goals can now be found in the care plan section) Acute Rehab PT Goals Patient Stated Goal: to go home PT Goal Formulation: With patient Time For Goal Achievement: 08/26/21 Potential to Achieve Goals: Fair Progress towards PT goals: Progressing toward goals    Frequency    Min  2X/week      PT Plan Current plan remains appropriate    Co-evaluation              AM-PAC PT "6 Clicks" Mobility   Outcome Measure  Help needed turning from your back to your side while in a flat bed without using bedrails?: A Little Help needed moving from lying on your back to sitting on the side of a flat bed without using bedrails?: A Little Help needed moving to and from a bed to a chair (including a wheelchair)?: A Lot Help needed standing up from a chair using your arms (e.g., wheelchair or bedside chair)?: A Lot Help needed to walk in hospital room?: A Lot Help needed climbing 3-5 steps with a railing? : Total 6 Click Score: 13    End of Session Equipment Utilized During Treatment: Oxygen Activity Tolerance: Patient limited by pain;Patient limited by fatigue Patient left: in chair;with chair alarm set;with nursing/sitter in room Nurse Communication: Mobility status PT Visit Diagnosis: Muscle weakness (generalized) (M62.81);Difficulty in walking, not elsewhere classified (R26.2);Pain;History of falling (Z91.81);Unsteadiness on feet (R26.81);Other abnormalities of gait and mobility (R26.89) Pain - Right/Left: Right Pain - part of body:  (ribs)     Time: 9628-3662 PT Time Calculation (min) (ACUTE ONLY): 13 min  Charges:  $Therapeutic Activity: 8-22 mins                    Maalik Pinn, PT, GCS 08/14/21,11:56 AM

## 2021-08-14 NOTE — Evaluation (Signed)
Occupational Therapy Evaluation Patient Details Name: [name] [name] MRN: [id number] DOB: 09-24-1935 Today's Date: 08/14/2021    History of Present Illness 86 y.o. male admitted on 08/12/21 after a ground level fall sustaining R lateral 6th and 7th rib fxs, acute L1 compression fx (NSYG consult pending), bil pleural effusions.  Pt with significant PMH of chronic A fib, CHF, COPD, CAD, dizziness, HTN, AVR, CABG.   Clinical Impression   PTA, pt was living alone and was performing ADLs and simple IADLs; daughter assisting with driving and grocery shopping. Pt currently requiring Mod A for UB ADLs, Max A for LB ADLs, and Min A +2 for functional transfers. Pt presenting with decreased cognition, balance, strength, and activity tolerance. Providing education on importance of sitting in recliner for upright posture and pt agreeable. Pt would benefit from further acute OT to facilitate safe dc. Recommend dc to SNF for further OT to optimize safety, independence with ADLs, and return to PLOF.     Follow Up Recommendations  SNF (Pending progress, possibly home with home health.)    Equipment Recommendations  3 in 1 bedside commode    Recommendations for Other Services       Precautions / Restrictions Precautions Precautions: Fall Precaution Comments: monitor Oxygen saturation Restrictions Weight Bearing Restrictions: No      Mobility Bed Mobility Overal bed mobility: Needs Assistance Bed Mobility: Supine to Sit     Supine to sit: Mod assist;HOB elevated     General bed mobility comments: Assisting to bring BELs over toEOB. Mod A for elevating trunk due to pain at ribs    Transfers Overall transfer level: Needs assistance Equipment used: 2 person hand held assist Transfers: Sit to/from [employer] Sit to Stand: Mod assist;From elevated surface Stand pivot transfers: Mod assist;+2 physical assistance       General transfer comment: mod assist for sit to stand  from elevated bed.    Balance Overall balance assessment: Needs assistance Sitting-balance support: Feet supported Sitting balance-Leahy Scale: Fair     Standing balance support: Bilateral upper extremity supported;During functional activity Standing balance-Leahy Scale: Poor Standing balance comment: requires B UE support, assistance for balance                           ADL either performed or assessed with clinical judgement   ADL Overall ADL's : Needs assistance/impaired Eating/Feeding: Set up;Sitting   Grooming: Sitting;Set up   Upper Body Bathing: Moderate assistance;Sitting   Lower Body Bathing: Maximal assistance;Sitting/lateral leans   Upper Body Dressing : Moderate assistance;Sitting   Lower Body Dressing: Maximal assistance;Sit to/from stand   Toilet Transfer: Moderate assistance;Cueing for safety;Stand-pivot Toilet Transfer Details (indicate cue type and reason): Mod A for power up and pivot to chair Toileting- Clothing Manipulation and Hygiene: Moderate assistance       Functional mobility during ADLs: Moderate assistance;Cueing for safety;Cueing for sequencing;+2 for physical assistance General ADL Comments: Pt with decreased functional strength, endurance, and pain limiting ability to complete self-care tasks.     Vision Baseline Vision/History: 0 No visual deficits Ability to See in Adequate Light: 0 Adequate Vision Assessment?: No apparent visual deficits     Perception     Praxis      Pertinent Vitals/Pain Pain Assessment: Faces Faces Pain Scale: Hurts little more Pain Location: ribs when breathing and coughing, pain all over. Pain Descriptors / Indicators: Guarding;Grimacing;Discomfort Pain Intervention(s): Monitored during session;Limited activity within patient's tolerance  Hand Dominance Right   Extremity/Trunk Assessment Upper Extremity Assessment Upper Extremity Assessment: Generalized weakness   Lower Extremity  Assessment Lower Extremity Assessment: Defer to PT evaluation   Cervical / Trunk Assessment Cervical / Trunk Assessment: Other exceptions Cervical / Trunk Exceptions: Active L1 Compression fx   Communication Communication Communication: HOH   Cognition Arousal/Alertness: Awake/alert;Lethargic Behavior During Therapy: WFL for tasks assessed/performed Overall Cognitive Status: No family/caregiver present to determine baseline cognitive functioning Area of Impairment: Awareness;Attention;Memory;Following commands;Problem solving;Safety/judgement                 Orientation Level: Person;Place;Situation (Pt able to state name, DOB, location, and reason for hospital admit) Current Attention Level: Sustained Memory: Decreased short-term memory Following Commands: Follows one step commands with increased time Safety/Judgement: Decreased awareness of deficits;Decreased awareness of safety Awareness: Emergent;Intellectual Problem Solving: Difficulty sequencing;Requires verbal cues;Requires tactile cues General Comments: Following simple commands with cues and increased time. Eager to participate. Repeating certain questions.   General Comments  Pt BP 118/52 after sit<>supine to EOB. SpO2 90s on 3L    Exercises     Shoulder Instructions      Home Living Family/patient expects to be discharged to:: Private residence Living Arrangements: Alone Available Help at Discharge: Family;Neighbor;Available PRN/intermittently Type of Home: Apartment Home Access: Stairs to enter Entrance Stairs-Number of Steps: 1 Entrance Stairs-Rails: None Home Layout: One level     Bathroom Shower/Tub: Chief Strategy Officer: Standard     Home Equipment: Environmental consultant - 4 wheels   Additional Comments: Daughter is primary caregiver and pt reports she buys and delivers groceries for him.      Prior Functioning/Environment Level of Independence: Independent with assistive device(s)         Comments: per pt, he performs ADLs and IADLs and uses rollator at home, no O2 use PTA. Pt reports his daughter drives to grocery store for him        OT Problem List: Decreased strength;Decreased range of motion;Decreased activity tolerance;Impaired balance (sitting and/or standing);Decreased cognition;Decreased safety awareness;Decreased knowledge of use of DME or AE;Pain      OT Treatment/Interventions: Self-care/ADL training;Therapeutic exercise;Therapeutic activities;Patient/family education;Balance training;DME and/or AE instruction    OT Goals(Current goals can be found in the care plan section) Acute Rehab OT Goals Patient Stated Goal: To go home OT Goal Formulation: With patient Time For Goal Achievement: 08/28/21 Potential to Achieve Goals: Good  OT Frequency: Min 2X/week   Barriers to D/C:            Co-evaluation              AM-PAC OT "6 Clicks" Daily Activity     Outcome Measure Help from another person eating meals?: A Little Help from another person taking care of personal grooming?: A Lot Help from another person toileting, which includes using toliet, bedpan, or urinal?: A Lot Help from another person bathing (including washing, rinsing, drying)?: A Lot Help from another person to put on and taking off regular upper body clothing?: A Lot Help from another person to put on and taking off regular lower body clothing?: A Lot 6 Click Score: 13   End of Session Equipment Utilized During Treatment: Oxygen Nurse Communication: Mobility status  Activity Tolerance: Patient limited by pain Patient left: in chair;with call bell/phone within reach;with chair alarm set;with nursing/sitter in room  OT Visit Diagnosis: Unsteadiness on feet (R26.81);Other abnormalities of gait and mobility (R26.89);Muscle weakness (generalized) (M62.81);Pain Pain - Right/Left: Right Pain - part of body:  (  ribs and back)                Time: 0865-7846 OT Time Calculation (min): 21  min Charges:  OT General Charges $OT Visit: 1 Visit OT Evaluation $OT Eval Moderate Complexity: 1 Mod  Genieve Ramaswamy MSOT, OTR/L Acute Rehab Pager: [phone] Office: [phone]  Theodoro Grist Rufus Beske 08/14/2021, 1:06 PM

## 2021-08-15 ENCOUNTER — Inpatient Hospital Stay (HOSPITAL_COMMUNITY): Payer: Medicare Other

## 2021-08-15 DIAGNOSIS — I4891 Unspecified atrial fibrillation: Secondary | ICD-10-CM | POA: Diagnosis not present

## 2021-08-15 DIAGNOSIS — G9341 Metabolic encephalopathy: Secondary | ICD-10-CM | POA: Diagnosis not present

## 2021-08-15 LAB — BASIC METABOLIC PANEL
Anion gap: 12 (ref 5–15)
BUN: 36 mg/dL — ABNORMAL HIGH (ref 8–23)
CO2: 27 mmol/L (ref 22–32)
Calcium: 8.7 mg/dL — ABNORMAL LOW (ref 8.9–10.3)
Chloride: 92 mmol/L — ABNORMAL LOW (ref 98–111)
Creatinine, Ser: 1.96 mg/dL — ABNORMAL HIGH (ref 0.61–1.24)
GFR, Estimated: 33 mL/min — ABNORMAL LOW (ref >60)
Glucose, Bld: 93 mg/dL (ref 70–99)
Potassium: 4 mmol/L (ref 3.5–5.1)
Sodium: 131 mmol/L — ABNORMAL LOW (ref 135–145)

## 2021-08-15 MED ORDER — LORAZEPAM 1 MG PO TABS
1.0000 mg | ORAL_TABLET | Freq: Once | ORAL | Status: AC
  Administered 2021-08-15: 1 mg via ORAL
  Filled 2021-08-15: qty 1

## 2021-08-15 MED ORDER — QUETIAPINE FUMARATE 25 MG PO TABS
25.0000 mg | ORAL_TABLET | Freq: Every day | ORAL | Status: DC
  Administered 2021-08-15: 25 mg via ORAL
  Filled 2021-08-15: qty 1

## 2021-08-15 MED ORDER — QUETIAPINE FUMARATE 25 MG PO TABS
25.0000 mg | ORAL_TABLET | Freq: Every day | ORAL | Status: DC
  Administered 2021-08-16 – 2021-08-17 (×2): 25 mg via ORAL
  Filled 2021-08-15 (×2): qty 1

## 2021-08-15 MED ORDER — LORAZEPAM 2 MG/ML IJ SOLN
1.0000 mg | Freq: Once | INTRAMUSCULAR | Status: AC | PRN
  Administered 2021-08-15: 1 mg via INTRAVENOUS
  Filled 2021-08-15: qty 1

## 2021-08-15 MED ORDER — IPRATROPIUM-ALBUTEROL 0.5-2.5 (3) MG/3ML IN SOLN: 3.0000 mL | Freq: Four times a day (QID) | RESPIRATORY_TRACT | Status: DC | PRN

## 2021-08-15 NOTE — Progress Notes (Signed)
Evaluated patient at bedside after receiving page that patient had a fall with associated head trauma.   On evaluation, patient is confused but appears to be at his baseline mental status per RN. He is lethargic but this could likely be secondary to receiving seroquel earlier. On exam, strength and sensation is symmetrically intact. Did not cooperate fully with H test. Eyes are pinpoint and sluggishly reactive to light. Does have an area of ecchymosis and swelling on posterior scalp, nontender at this time.   Plan: -Will f/u with CT scan of head -PO ativan 1mg  prior to imaging to minimize motion artifact (does not currently have IV access)

## 2021-08-15 NOTE — Progress Notes (Signed)
Pt constantly pulling foley catheter.  Had brown urine out put.  C/o wanting to urinate.  Bladder scaned pt and showed 516 mL.  Received order to flush.  Flushed with no urine return.  Received order to remove foley.  Pt voided of bloody urine right after removal of foley.  Will follow up the protocol.  Hinton Dyer, RN

## 2021-08-15 NOTE — Progress Notes (Signed)
[name] [name] is 86 year old male with past medical history of A fib, CHF, COPD, CAD, HLD, HTN, GERD, and depression that presented from OSH after a fall that resulted in multiple rib fractures and spinal fracture on hospital day 3.  Subjective:  Patient denied any acute complaints. Was confused to place and situation this morning.    Overnight: Patient became agitated overnight. Started pulling on IV and foley. Became calm after a waist belt was applied.   Objective:  Vital signs in last 24 hours: Vitals:   08/14/21 1135 08/14/21 1509 08/14/21 1558 08/14/21 1953  BP: (!) 120/52  (!) 127/48 (!) 107/44  Pulse: 79  91 85  Resp: 20  (!) 22 20  Temp: 98 F (36.7 C)  98 F (36.7 C) 97.6 F (36.4 C)  TempSrc: Oral  Oral Oral  SpO2: 100% 100%  90%  Weight:      Height:       Physical Exam General: NAD, comfortable, obese; in recliner CV: regular rate, irregular rhythm, 2+ pulses Lungs: rales up to midlung, no wheeze, or rhonchi present.  Abdomen: TTP to right abdomen; normal bowel sounds MSK: Trace edema Neuro: Alert, oriented to person, but not place, and situation.   Active Problems:   Atrial fibrillation (HCC)   CHF exacerbation (HCC)   Fracture of multiple ribs   Closed compression fracture of body of L1 vertebra (HCC)   Fall   Acute on chronic systolic heart failure (HCC)   Acute encephalopathy CBC Latest Ref Rng & Units 08/14/2021 08/13/2021 08/12/2021  WBC 4.0 - 10.5 K/uL 13.2(H) 11.0(H) 13.3(H)  Hemoglobin 13.0 - 17.0 g/dL 11.2(L) 10.7(L) 11.0(L)  Hematocrit 39.0 - 52.0 % 35.8(L) 34.2(L) 35.0(L)  Platelets 150 - 400 K/uL 193 198 215    BMP Latest Ref Rng & Units 08/15/2021 08/14/2021 08/13/2021  Glucose 70 - 99 mg/dL 93 98 973(Z)  BUN 8 - 23 mg/dL 32(D) 92(E) 26(S)  Creatinine 0.61 - 1.24 mg/dL 3.41(D) 6.22(W) 9.79(G)  BUN/Creat Ratio 10 - 24 - - -  Sodium 135 - 145 mmol/L 131(L) 136 138  Potassium 3.5 - 5.1 mmol/L 4.0 3.7 3.8  Chloride 98 - 111 mmol/L 92(L) 96(L)  98  CO2 22 - 32 mmol/L 27 30 29   Calcium 8.9 - 10.3 mg/dL ) 9.2(J) 9.0    Acute metabolic encephalopathy Determined to be delirium due to urinary retention. It is complicated by patient being in the hospital and having significant dementia which in itself can induce dementia. Has prolonged Qtc at 519. Considering Seroquel as patient may be sundowning.   Plan: -Control pain and continue to monitor  -Morpine 2mg  q2 hrs prn, Oxy 5 mg q4hr prn -Foley cath as patient has urinary retention  -urine output of 400 mL and net negative of -30 mL. -PO lasix 80 mg 08/14/21 at 3pm. Last IV lasix given 3pm on 08/13/21.   Rib fracture/spinal fracture 2/2 Fall Patient has multiple rib fractures with imaging showing non-displaced right lateral 7th and mildly displaced right lateral 6th rib fractures. It also shows L1 compression fracture.   Plan: -NSG consult pending for spinal fracture. Placed by trauma surgery.  -Trauma surgery recommends pain control, pulmonary toilet, incentive spirometry   Atrial fibrillation Patient has past medical history of a-fib but is not on anticoagulation per family's preferences as he has frequent falls. No on beta blocker due to family preference as well.    Plan: -Continue to monitor as HR stays <100.   Acute  on chronic systolicCHF Patient has CHF with EF of 30-35% with severe dilation of left atrium. Currently on home lasix 40 mg daily. His BNP was 1,193 on 08/12/21.   Plan: -IV lasix 08/13/21 -PO lasix 80 mg 08/14/21 and then home dose of 40 mg starting 08/15/21   -urine output of 400 mL and net negative of -30 mL.  COPD Patient has COPD with extensive smoking history. He has not been using his inhalers regularly per the daughter.    Plan: -Duonebs q 4hrs prn -Breo and Incruse   Hypertension: Patient has hypertension on Losartan 25 mg. Currently 112/54. May get hypertensive due to pain 2/2 to multiple fractures.   Plan: -Continue home Losartan 25 mg  daily -Pain control -Continue to monitor    Hematuria: Had RBCs on last UA done after straight cath. Forcefully removed catheter yesterday and has more hematuria this am.   Plan: -Continue to monitor -Would repeat outpatient after it heals      DVT prophx: Lovenox Diet:Cardiac Bowel:PRN Code:DNR   Prior to Admission Living Arrangement:Home Anticipated Discharge Location:SNF Barriers to Discharge:Medical Clearance   Dispo: Admit patient to Inpatient with expected length of stay greater than 2 midnights.   Signed: Gwenevere Abbot, MD 08/12/2021, 10:39 AM  Pager: [phone] After 5 and on weekends: Call on-call pager

## 2021-08-15 NOTE — TOC CAGE-AID Note (Signed)
Transition of Care [facility]) - CAGE-AID Screening   Patient Details  Name: [name] [name] MRN: [id number] Date of Birth: February 07, 1935  Transition of [facility]) CM/SW Contact:    Cele Mote C Tarpley-Carter, LCSWA Phone Number: 08/15/2021, 12:00 PM   Clinical Narrative: Pt participated in Cage-Aid.  Pt stated he does not use substance or ETOH.  Pt was not offered resources, due to no usage of substance or ETOH.     Amahd Morino Tarpley-Carter, MSW, LCSW-A Pronouns:  She/Her/Hers [facility]itions of Care Clinical Social Worker Direct Number:  [phone] [email or web address]  CAGE-AID Screening:    Have You Ever Felt You Ought to Cut Down on Your Drinking or Drug Use?: No Have People Annoyed You By [employer] Your Drinking Or Drug Use?: No Have You Felt Bad Or Guilty About Your Drinking Or Drug Use?: No Have You Ever Had a Drink or Used Drugs First Thing In The Morning to Steady Your Nerves or to Get Rid of a Hangover?: No CAGE-AID Score: 0  Substance Abuse Education Offered: No

## 2021-08-15 NOTE — Progress Notes (Signed)
Pt bed alarm went off found pt attempting to get out of bed. Pt insisted on getting up to chair to sleep. Pt placed in chair by staff and Velcro lap belt applied to pt for safety. Pt given water as requested. Dr. Hanley Ben updated on pt condition.

## 2021-08-15 NOTE — Progress Notes (Signed)
RT found patient on room air, sats 86%. RT placed patient on Ducor 3L, sats improved to 91%.  RN notified

## 2021-08-15 NOTE — Progress Notes (Signed)
Met with patient daughters Adela Lank and Georgina Peer to offer support.  We discussed their understanding of all that was going on with their dad right now.  They shared that their father coming to live with either of them at time of discharge.  They also report that the patient returning to his preadmission independent living situation is not in their opinion a good plan.    In our conversation we discussed what other possible discharge plans there could be and both daughters were open to meeting with the palliative care team to assist in what support could be offered as the patient progresses.    I made Dr. Heber [place] and his team aware of this conversation  Richardean Canal RN, BSN, CCRN-K

## 2021-08-15 NOTE — Progress Notes (Signed)
Order received from Dr. Alroy Bailiff to irrigate foley catheter because pt complained of not being able to urinate. No issues noted after irrigation. Pt stated no discomfort felt and the urge is no longer present. Post irrigation pt bladder scanned no residual found.

## 2021-08-15 NOTE — Progress Notes (Signed)
Pt has been confused all shift pulling out IV also pulling off monitoring equipment. Staff has made multiple attempts to reorient pt. Pt continues to yell and bang call light on bedside table. Pt provided drinks and offered fluids all shift, repositioned multiple times for comfort. Pt was up in chair with alarm found pt standing pulling at foley on one occasion so pt was standing up naked pulling on foley catheter. Pt then placed in bed with alarm for safety. Dr. Alroy Bailiff has been notified about pt behavior throughout shift. Will continue to monitor pt closely.

## 2021-08-15 NOTE — TOC Progression Note (Signed)
Transition of Care [facility]) - Progression Note    Patient Details  Name: [name] [name] MRN: [id number] Date of Birth: 1935-09-02  [facility]) CM/SW Contact  Terrial Rhodes, LCSWA Phone Number: 08/15/2021, 3:08 PM  Clinical Narrative:     CSW following patient for SNF placement. CSW will continue to follow and assist with dc planning needs.  Expected Discharge Plan: Skilled Nursing Facility Barriers to Discharge: Continued Medical Work up  Expected Discharge Plan and Services Expected Discharge Plan: Skilled Nursing Facility In-house Referral: Clinical Social Work     Living arrangements for the past 2 months: Single Family Home                                       Social Determinants of Health (SDOH) Interventions    Readmission Risk Interventions No flowsheet data found.

## 2021-08-15 NOTE — Progress Notes (Signed)
   08/15/21 1715  What Happened  Was fall witnessed? Yes  Was patient injured? Yes  Patient found on floor  Found by Staff-comment Neita Goodnight)  Stated prior activity ambulating-unassisted  Follow Up  MD notified Dr. Mikey Bussing  Time MD notified 1721  Family notified Yes - comment  Time family notified 1722  Additional tests Yes-comment (Head CT)  Simple treatment Ice  Progress note created (see row info) Yes  Adult Fall Risk Assessment  Risk Factor Category (scoring not indicated) High fall risk per protocol (document High fall risk)  Patient Fall Risk Level High fall risk  Adult Fall Risk Interventions  Required Bundle Interventions *See Row Information* High fall risk - low, moderate, and high requirements implemented  Additional Interventions Use of appropriate toileting equipment (bedpan, BSC, etc.);Room near nurses station;Reorient/diversional activities with confused patients;HeadStart chair sensor (education/return demonstration)  Screening for Fall Injury Risk (To be completed on HIGH fall risk patients) - Assessing Need for Floor Mats  Risk For Fall Injury- Criteria for Floor Mats Admitted as a result of a fall  Will Implement Floor Mats Yes  Vitals  Temp 98.3 F (36.8 C)  Temp Source Oral  BP 112/60  MAP (mmHg) 74  BP Location Left Arm  BP Method Automatic  Patient Position (if appropriate) Lying  Pulse Rate 90  Pulse Rate Source Monitor  ECG Heart Rate 90  Resp 16  Oxygen Therapy  SpO2 94 %  O2 Device Nasal Cannula  O2 Flow Rate (L/min) 3 L/min  Pain Assessment  Pain Scale PAINAD  PAINAD (Pain Assessment in Advanced Dementia)  Breathing 0  Negative Vocalization 1  Facial Expression 0  Body Language 0  Consolability 0  PAINAD Score 1  PCA/Epidural/Spinal Assessment  Respiratory Pattern Regular;Unlabored  Incentive Spirometry - Achieved (mL) (RN, NT, or RT) 500 mL  Neurological  Neuro (WDL) X  Level of Consciousness Alert  Orientation Level  Oriented to person;Disoriented to time;Disoriented to situation;Disoriented to place  Cognition Poor judgement;Poor safety awareness;Poor attention/concentration;Impulsive  Speech Clear  Glasgow Coma Scale  Eye Opening 4  Best Verbal Response (NON-intubated) 5  Best Motor Response 6  Glasgow Coma Scale Score 15  Musculoskeletal  Musculoskeletal (WDL) X  Assistive Device Standard walker

## 2021-08-16 ENCOUNTER — Inpatient Hospital Stay (HOSPITAL_COMMUNITY): Payer: Medicare Other

## 2021-08-16 DIAGNOSIS — I4891 Unspecified atrial fibrillation: Secondary | ICD-10-CM | POA: Diagnosis not present

## 2021-08-16 DIAGNOSIS — G9341 Metabolic encephalopathy: Secondary | ICD-10-CM | POA: Diagnosis not present

## 2021-08-16 LAB — BASIC METABOLIC PANEL
Anion gap: 8 (ref 5–15)
BUN: 41 mg/dL — ABNORMAL HIGH (ref 8–23)
CO2: 29 mmol/L (ref 22–32)
Calcium: 8.4 mg/dL — ABNORMAL LOW (ref 8.9–10.3)
Chloride: 91 mmol/L — ABNORMAL LOW (ref 98–111)
Creatinine, Ser: 2.2 mg/dL — ABNORMAL HIGH (ref 0.61–1.24)
GFR, Estimated: 29 mL/min — ABNORMAL LOW (ref >60)
Glucose, Bld: 98 mg/dL (ref 70–99)
Potassium: 3.9 mmol/L (ref 3.5–5.1)
Sodium: 128 mmol/L — ABNORMAL LOW (ref 135–145)

## 2021-08-16 LAB — MAGNESIUM: Magnesium: 2.5 mg/dL — ABNORMAL HIGH (ref 1.7–2.4)

## 2021-08-16 MED ORDER — ACETAMINOPHEN 650 MG RE SUPP
650.0000 mg | Freq: Four times a day (QID) | RECTAL | Status: DC | PRN
  Administered 2021-08-16: 650 mg via RECTAL
  Filled 2021-08-16: qty 1

## 2021-08-16 MED ORDER — POTASSIUM CHLORIDE CRYS ER 20 MEQ PO TBCR
40.0000 meq | EXTENDED_RELEASE_TABLET | Freq: Once | ORAL | Status: DC
  Filled 2021-08-16: qty 2

## 2021-08-16 NOTE — Progress Notes (Addendum)
[name] [name] is 85 year old male with past medical history of A fib, CHF, COPD, CAD, HLD, HTN, GERD, and depression that presented from OSH after a fall that resulted in multiple rib fractures and spinal fracture on hospital day 4.  Subjective:  Patient more lethargic today. He was given Seroquel at bedtime. He denied any pain or acute concerns.    Overnight: Patient had a fall overnight. CT negative for acute findings.   Objective:  Vital signs in last 24 hours: Vitals:   08/15/21 1715 08/15/21 2100 08/16/21 0100 08/16/21 0416  BP: 112/60 (!) 103/44 (!) 95/49 (!) 89/58  Pulse: 90 76 75 79  Resp: 16 16 18 20   Temp: 98.3 F (36.8 C) 97.9 F (36.6 C) 98 F (36.7 C) (!) 97.5 F (36.4 C)  TempSrc: Oral Axillary Axillary Axillary  SpO2: 94% 92% 95% 90%  Weight:      Height:       Physical Exam General: NAD, obese, lethargic CV: regular rate, irregular rhythm Lungs: limited due to anterior ausculation and lethargy, no wheeze, rales heard. Coarse sounds in the upper airways.  Abdomen: No TTP, normal bowel sounds MSK: Trace edema Neuro: Lethargic; opens eyes to command. Could not fully assess   Active Problems:   Atrial fibrillation (HCC)   CHF exacerbation (HCC)   Fracture of multiple ribs   Closed compression fracture of body of L1 vertebra (HCC)   Fall   Acute on chronic systolic heart failure (HCC)   Acute encephalopathy  Acute metabolic encephalopathy Determined to be delirium due to urinary retention. It is complicated by patient being in the hospital and having significant dementia which in itself can induce dementia. Had prolonged Qtc. Given Sreroquel 25 mg. Patient continues to have delirium, so will consider palliative consult for GOC.  Plan: -Monitor pain but hold pain meds due to lethargy  -Foley cath was removed so I/O cath as needed  -urine output of 350 mL and net positive of 110 mL. -PO lasix 80 mg 08/14/21 at 3pm. Last IV lasix given 3pm on 08/13/21.  Home Lasix 40 mg daily.  -Seroquel 25 mg qhs; Qtc was 454; Will keep K>4, Mg>2.  -Consult palliative care   Rib fracture/spinal fracture 2/2 Fall Patient has multiple rib fractures with imaging showing non-displaced right lateral 7th and mildly displaced right lateral 6th rib fractures. It also shows L1 compression fracture. Plan: -Trauma surgery recommends pain control, pulmonary toilet, incentive spirometry; they placed NSG consult but patient not seen yet   Atrial fibrillation Patient has past medical history of a-fib but is not on anticoagulation per family's preferences as he has frequent falls. No on beta blocker due to family preference as well.  Plan: -Continue to monitor as HR stays <100.   Acute on chronic systolicCHF Patient has CHF with EF of 30-35% with severe dilation of left atrium. Currently on home lasix 40 mg daily. His BNP was 1,193 on 08/12/21. Plan: -IV lasix 08/13/21 -PO lasix 80 mg 08/14/21 and then home dose of 40 mg starting 08/15/21   -urine output of 350 mL and net positive of 350 mL. Hematuria: Had RBCs on last UA done after straight cath. Forceful remove catheter multiple times. Had clot in foley yesterday which required it to be removed. Patient later had lots of blood with in/out cath  Plan: -Urologist consulted; will see patient later today -Continue to monitor  Chronic Problems: COPD Patient has COPD with extensive smoking history. He has not been using  his inhalers regularly per the daughter.    Plan: -Duonebs q 4hrs prn -Breo and Incruse   Hypertension: Patient has hypertension on Losartan 25 mg. Currently 112/54. May get hypertensive due to pain 2/2 to multiple fractures.   Plan: -Continue home Losartan 25 mg daily -Pain control -Continue to monitor     DVT prophx: Lovenox Diet:Cardiac Bowel:PRN Code:DNR   Prior to Admission Living Arrangement:Home Anticipated Discharge Location:SNF Barriers to Discharge:Medical Clearance    Dispo: Admit patient to Inpatient with expected length of stay greater than 2 midnights.   Signed: Gwenevere Abbot, MD 08/12/2021, 10:39 AM  Pager: [phone] After 5 and on weekends: Call on-call pager

## 2021-08-16 NOTE — Progress Notes (Signed)
PT Cancellation Note  Patient Details Name: [name] [name] MRN: [id number] DOB: [birth year]   Cancelled Treatment:    Reason Eval/Treat Not Completed: Fatigue/lethargy limiting ability to participate; per nursing, patient active overnight, now lethargic on medicine to sleep. Will follow-up for PT treatment as schedule permits.  [name] [name], PT, DPT Acute Rehabilitation Services  Pager [phone] Office [phone]  Malachy Chamber 08/16/2021, 10:33 AM

## 2021-08-16 NOTE — Progress Notes (Signed)
Heart Failure Navigator Progress Note  Assessed for Heart & Vascular TOC clinic readiness.  Patient does not meet criteria due to pt has continued confusion, plan DC to SNF. Pt high fall risk and had safety sitter and restraints during hospitalization.   Navigator available for reassessment of patient.   Ozella Rocks, MSN, RN Heart Failure Nurse Navigator [phone]

## 2021-08-16 NOTE — Consult Note (Signed)
H&P Physician requesting consult: Carlynn Purl  Chief Complaint: Gross hematuria, difficult Foley catheter placement  History of Present Illness: 86 year old male with multiple medical comorbidities currently admitted with a fall resulting in multiple rib fractures and spinal fracture.  He is also being treated for acute metabolic encephalopathy.  He had a Foley catheter but due to traumatic removal, it has been removed.  Straight catheterization for urinary retention was performed with return of gross blood.  Patient is confused and unable to provide history.  Past Medical History:  Diagnosis Date   Arrhythmia    chronic atrial fibrillation   Arthritis    Atrial fibrillation (HCC)    chronic atrial fibrillation    CHF (congestive heart failure) (HCC)    most recent EF 50-55%   COPD (chronic obstructive pulmonary disease) (HCC)    chronic   Coronary artery disease    Depression    Dizziness    Fatigue    Heart murmur    Hernia NEC    hiatel   Hyperlipidemia    Hypertension    Reflux    Scarlet fever    Past Surgical History:  Procedure Laterality Date   AORTIC VALVE REPLACEMENT  2006   33-mm pericardial Edward's valve   CARDIAC CATHETERIZATION  09/13/2010   patent grafts. no significant pulmonary hypertension.    CORONARY ARTERY BYPASS GRAFT  2006   [place]    Home Medications:  Medications Prior to Admission  Medication Sig Dispense Refill Last Dose   albuterol (PROVENTIL) (2.5 MG/3ML) 0.083% nebulizer solution Inhale 2.5 mLs into the lungs every 6 (six) hours as needed for wheezing.   Past Week   aspirin 81 MG tablet Take 81 mg by mouth daily.   08/11/2021   furosemide (LASIX) 40 MG tablet Take 1 tablet (40 mg total) by mouth daily. 90 tablet 1 08/11/2021   losartan (COZAAR) 25 MG tablet Take 1 tablet (25 mg total) by mouth daily. 90 tablet 1 08/11/2021   potassium chloride (KLOR-CON) 10 MEQ CR tablet Take 20 mEq by mouth daily.   08/11/2021   TRELEGY ELLIPTA  100-62.5-25 MCG/INH AEPB Inhale 1 puff into the lungs daily as needed (For shortness of breath).   Past Week   vitamin B-12 (CYANOCOBALAMIN) 1000 MCG tablet Take 1,000 mcg by mouth daily.   08/11/2021   Allergies: No Known Allergies  Family History  Problem Relation Age of Onset   Cancer Mother    Heart attack Father    Heart attack Brother    Cancer Brother    Social History:  reports that he quit smoking about 40 years ago. His smoking use included cigarettes. He has a 30.00 pack-year smoking history. He has never used smokeless tobacco. He reports that he does not currently use alcohol. He reports that he does not use drugs.  ROS: A complete review of systems was performed.  All systems are negative except for pertinent findings as noted. ROS   Physical Exam:  Vital signs in last 24 hours: Temp:  [97.5 F (36.4 C)-100.4 F (38 C)] 98.7 F (37.1 C) (08/31 1711) Pulse Rate:  [71-97] 97 (08/31 1711) Resp:  [16-27] 27 (08/31 1711) BP: (89-156)/(44-72) 142/72 (08/31 1711) SpO2:  [90 %-95 %] 92 % (08/31 1711) Weight:  [105.7 kg] 105.7 kg (08/31 0500) General: Disoriented Genitourinary: Circumcised phallus.  Bilateral testicles descended without masses.  Laboratory Data:  Results for orders placed or performed during the hospital encounter of 08/12/21 (from the past 24 hour(s))  Basic metabolic panel     Status: Abnormal   Collection Time: 08/16/21  2:13 AM  Result Value Ref Range   Sodium 128 (L) 135 - 145 mmol/L   Potassium 3.9 3.5 - 5.1 mmol/L   Chloride 91 (L) 98 - 111 mmol/L   CO2 29 22 - 32 mmol/L   Glucose, Bld 98 70 - 99 mg/dL   BUN 41 (H) 8 - 23 mg/dL   Creatinine, Ser 9.98 (H) 0.61 - 1.24 mg/dL   Calcium 8.4 (L) 8.9 - 10.3 mg/dL   GFR, Estimated 29 (L) >60 mL/min   Anion gap 8 5 - 15  Magnesium     Status: Abnormal   Collection Time: 08/16/21  2:13 AM  Result Value Ref Range   Magnesium 2.5 (H) 1.7 - 2.4 mg/dL   Recent Results (from the past 240 hour(s))   Resp Panel by RT-PCR (Flu A&B, Covid) Nasopharyngeal Swab     Status: None   Collection Time: 08/12/21  5:04 AM   Specimen: Nasopharyngeal Swab; Nasopharyngeal(NP) swabs in vial transport medium  Result Value Ref Range Status   SARS Coronavirus 2 by RT PCR NEGATIVE NEGATIVE Final    Comment: (NOTE) SARS-CoV-2 target nucleic acids are NOT DETECTED.  The SARS-CoV-2 RNA is generally detectable in upper respiratory specimens during the acute phase of infection. The lowest concentration of SARS-CoV-2 viral copies this assay can detect is 138 copies/mL. A negative result does not preclude SARS-Cov-2 infection and should not be used as the sole basis for treatment or other patient management decisions. A negative result may occur with  improper specimen collection/handling, submission of specimen other than nasopharyngeal swab, presence of viral mutation(s) within the areas targeted by this assay, and inadequate number of viral copies(<138 copies/mL). A negative result must be combined with clinical observations, patient history, and epidemiological information. The expected result is Negative.  Fact Sheet for Patients:  BloggerCourse.com  Fact Sheet for Healthcare Providers:  SeriousBroker.it  This test is no t yet approved or cleared by the [place] FDA and  has been authorized for detection and/or diagnosis of SARS-CoV-2 by FDA under an Emergency Use Authorization (EUA). This EUA will remain  in effect (meaning this test can be used) for the duration of the COVID-19 declaration under Section 564(b)(1) of the Act, 21 U.S.C.section 360bbb-3(b)(1), unless the authorization is terminated  or revoked sooner.       Influenza A by PCR NEGATIVE NEGATIVE Final   Influenza B by PCR NEGATIVE NEGATIVE Final    Comment: (NOTE) The Xpert Xpress SARS-CoV-2/FLU/RSV plus assay is intended as an aid in the diagnosis of influenza from  Nasopharyngeal swab specimens and should not be used as a sole basis for treatment. Nasal washings and aspirates are unacceptable for Xpert Xpress SARS-CoV-2/FLU/RSV testing.  Fact Sheet for Patients: BloggerCourse.com  Fact Sheet for Healthcare Providers: SeriousBroker.it  This test is not yet approved or cleared by the [place] FDA and has been authorized for detection and/or diagnosis of SARS-CoV-2 by FDA under an Emergency Use Authorization (EUA). This EUA will remain in effect (meaning this test can be used) for the duration of the COVID-19 declaration under Section 564(b)(1) of the Act, 21 U.S.C. section 360bbb-3(b)(1), unless the authorization is terminated or revoked.  Performed at [facility], [address], [place], [place] [address]    Creatinine: Recent Labs    08/12/21 0457 08/13/21 0445 08/14/21 0146 08/15/21 0108 08/16/21 0213  CREATININE 1.78* 1.94* 1.94* 1.96* 2.20*  Procedure: Simple Foley catheter insertion.  Under sterile conditions, I advanced a 20 [place] coud catheter into the bladder without resistance.  Clear yellow urine returned.  20 cc of sterile water was instilled into the catheter balloon.  Impression/Assessment:  Urinary retention Traumatic Foley catheter insertion  Plan:  Maintain Foley catheter for least 7 days.  Continue tamsulosin.  Remove catheter once more coherent and more ambulatory.  Ray Church, III 08/16/2021, 5:46 PM

## 2021-08-16 NOTE — TOC Progression Note (Signed)
Transition of Care [facility]) - Progression Note    Patient Details  Name: ROMANO STIGGER MRN: [id number] Date of Birth: 1935-02-08  [facility] CM/SW Contact  Terrial Rhodes, LCSWA Phone Number: 08/16/2021, 1:53 PM  Clinical Narrative:     CSW following patient for SNF placement. CSW will continue to follow and assist with dc planning needs.  Expected Discharge Plan: Skilled Nursing Facility Barriers to Discharge: Continued Medical Work up  Expected Discharge Plan and Services Expected Discharge Plan: Skilled Nursing Facility In-house Referral: Clinical Social Work     Living arrangements for the past 2 months: Single Family Home                                       Social Determinants of Health (SDOH) Interventions    Readmission Risk Interventions No flowsheet data found.

## 2021-08-16 NOTE — Progress Notes (Signed)
Pt remains lethargic. MD Welton Flakes paged, RN asked if a repeat head CT should be done. MD Welton Flakes notified RN that repeat CT was not indicated at this time. Will continue to monitor pt.

## 2021-08-16 NOTE — Therapy (Signed)
OT Cancellation Note  Patient Details Name: [name] [name] MRN: [id number] DOB: 07/19/1935   Cancelled Treatment:    Reason Eval/Treat Not Completed: Other (comment) Spoke with NT/sitter and pt had been up/awake for >24 hours, confused, removing IV/lines and w/ reported fall last night. He is currently sleeping soundly after being given medications, sitter in room. Will check back for appropriateness for OT as schedule allows.  Charletta Cousin, Haide Klinker Beth Dixon, OTR/L 08/16/2021, 10:10 AM

## 2021-08-16 NOTE — Progress Notes (Signed)
I&O cath performed.  Immediate dark blood returned.  Drainage become more viscous and began to clot in drainage bag. Total of 120 ml dark bloody output with lots of clots drained.  When catheter removed a large clot came out on the tip of the catheter followed by more dark bloody drainage.     Jacqulyn Cane RN, BSN, CCRN

## 2021-08-16 NOTE — Progress Notes (Signed)
   08/16/21 0100  Assess: MEWS Score  Temp 98 F (36.7 C)  BP (!) 95/49  Pulse Rate 75  ECG Heart Rate 74  Resp 18  Level of Consciousness Responds to Pain  SpO2 95 %  O2 Device Room Air  Assess: MEWS Score  MEWS Temp 0  MEWS Systolic 1  MEWS Pulse 0  MEWS RR 0  MEWS LOC 2  MEWS Score 3  MEWS Score Color Yellow  Assess: if the MEWS score is Yellow or Red  Were vital signs taken at a resting state? Yes  Focused Assessment Change from prior assessment (see assessment flowsheet)  Early Detection of Sepsis Score *See Row Information* Low  MEWS guidelines implemented *See Row Information* Yes  Treat  MEWS Interventions Escalated (See documentation below)  Take Vital Signs  Increase Vital Sign Frequency  Yellow: Q 2hr X 2 then Q 4hr X 2, if remains yellow, continue Q 4hrs  Escalate  MEWS: Escalate Yellow: discuss with charge nurse/RN and consider discussing with provider and RRT  Notify: Charge Nurse/RN  Name of Charge Nurse/RN Notified Christina, RN  Date Charge Nurse/RN Notified 08/16/21  Time Charge Nurse/RN Notified 0200  Notify: Provider  Provider Name/Title [place]  Date Provider Notified 08/16/21  Time Provider Notified 0208  Notification Type Page  Notification Reason Change in status  Provider response No new orders  Date of Provider Response 08/16/21  Time of Provider Response 0209  Document  Patient Outcome Other (Comment) (MD notified-will continue to monitor)  Progress note created (see row info) Yes

## 2021-08-17 DIAGNOSIS — G9340 Encephalopathy, unspecified: Secondary | ICD-10-CM

## 2021-08-17 DIAGNOSIS — Z515 Encounter for palliative care: Secondary | ICD-10-CM

## 2021-08-17 DIAGNOSIS — Z7189 Other specified counseling: Secondary | ICD-10-CM

## 2021-08-17 DIAGNOSIS — Z66 Do not resuscitate: Secondary | ICD-10-CM

## 2021-08-17 LAB — BASIC METABOLIC PANEL
Anion gap: 12 (ref 5–15)
Anion gap: 11 (ref 5–15)
BUN: 48 mg/dL — ABNORMAL HIGH (ref 8–23)
BUN: 51 mg/dL — ABNORMAL HIGH (ref 8–23)
CO2: 27 mmol/L (ref 22–32)
CO2: 26 mmol/L (ref 22–32)
Calcium: 8.3 mg/dL — ABNORMAL LOW (ref 8.9–10.3)
Calcium: 8.4 mg/dL — ABNORMAL LOW (ref 8.9–10.3)
Chloride: 90 mmol/L — ABNORMAL LOW (ref 98–111)
Chloride: 92 mmol/L — ABNORMAL LOW (ref 98–111)
Creatinine, Ser: 2.32 mg/dL — ABNORMAL HIGH (ref 0.61–1.24)
Creatinine, Ser: 2.01 mg/dL — ABNORMAL HIGH (ref 0.61–1.24)
GFR, Estimated: 27 mL/min — ABNORMAL LOW (ref >60)
GFR, Estimated: 32 mL/min — ABNORMAL LOW (ref >60)
Glucose, Bld: 106 mg/dL — ABNORMAL HIGH (ref 70–99)
Glucose, Bld: 116 mg/dL — ABNORMAL HIGH (ref 70–99)
Potassium: 4 mmol/L (ref 3.5–5.1)
Potassium: 3.8 mmol/L (ref 3.5–5.1)
Sodium: 129 mmol/L — ABNORMAL LOW (ref 135–145)
Sodium: 129 mmol/L — ABNORMAL LOW (ref 135–145)

## 2021-08-17 LAB — BLOOD CULTURE ID PANEL (REFLEXED) - BCID2

## 2021-08-17 MED ORDER — CHLORHEXIDINE GLUCONATE CLOTH 2 % EX PADS
6.0000 | MEDICATED_PAD | Freq: Every day | CUTANEOUS | Status: DC
  Administered 2021-08-17 – 2021-08-18 (×2): 6 via TOPICAL

## 2021-08-17 MED ORDER — SODIUM CHLORIDE 0.9 % IV SOLN
3.0000 g | Freq: Two times a day (BID) | INTRAVENOUS | Status: DC
  Administered 2021-08-17 – 2021-08-18 (×2): 3 g via INTRAVENOUS
  Filled 2021-08-17 (×4): qty 8

## 2021-08-17 NOTE — Progress Notes (Signed)
Occupational Therapy Treatment Patient Details Name: [name] [name] MRN: [id number] DOB: 04-18-1935 Today's Date: 08/17/2021    History of present illness 86 y.o. male admitted on 08/12/21 after a ground level fall sustaining R lateral 6th and 7th rib fxs, acute L1 compression fx (NSYG consult pending), bil pleural effusions.  Pt with significant PMH of chronic A fib, CHF, COPD, CAD, dizziness, HTN, AVR, CABG.   OT comments  Pt initially very hard to rouse requiring mod A +2 to come to EOB, unaware of where he was - but cooperative for the most part. Mod A +2 (face to face) with multimodal cues for short ambulation (unable to make it to the sink from EOB today) and for SPT to the recliner. Pt was able to do minor grooming tasks with min A for task initiation seated in recliner. Max A for LB ADL at this time. OT will continue to follow acutely - hopeful for cognitive improvement (he was administered ativan last night). Daughters present at the end of the session as well as his Recruitment consultant.  In chair with posey belt alarm on.   Follow Up Recommendations  SNF    Equipment Recommendations  3 in 1 bedside commode    Recommendations for Other Services      Precautions / Restrictions Precautions Precautions: Fall Precaution Comments: monitor Oxygen saturation Restrictions Weight Bearing Restrictions: No       Mobility Bed Mobility Overal bed mobility: Needs Assistance Bed Mobility: Supine to Sit   Sidelying to sit: Mod assist;+2 for physical assistance;+2 for safety/equipment;HOB elevated       General bed mobility comments: very lethargic initially and lots of multimodal cues for sequencing    Transfers Overall transfer level: Needs assistance Equipment used: 2 person hand held assist Transfers: Sit to/from [employer] Transfers Sit to Stand: Mod assist;+2 physical assistance;+2 safety/equipment Stand pivot transfers: Mod assist;+2 physical assistance       General  transfer comment: PT/OT assisted with weight shifting for leg progression and movement along with verbal cues for sequencing. heavy BUE support and posterior lean initially    Balance Overall balance assessment: Needs assistance Sitting-balance support: Feet supported Sitting balance-Leahy Scale: Poor     Standing balance support: Bilateral upper extremity supported;During functional activity Standing balance-Leahy Scale: Poor Standing balance comment: requires B UE support, assistance for balance                           ADL either performed or assessed with clinical judgement   ADL Overall ADL's : Needs assistance/impaired     Grooming: Minimal assistance;Wash/dry face;Wash/dry hands;Sitting Grooming Details (indicate cue type and reason): EOB, min A to initiate task             Lower Body Dressing: Maximal assistance;Sit to/from stand Lower Body Dressing Details (indicate cue type and reason): to don socks Toilet Transfer: Moderate assistance;+2 for physical assistance;+2 for safety/equipment;Ambulation Toilet Transfer Details (indicate cue type and reason): 2 person HHA and multimodal cues for sequencing/progression         Functional mobility during ADLs: Moderate assistance;+2 for physical assistance;+2 for safety/equipment (2 person HHA) General ADL Comments: Pt with decreased cognition,  functional strength, endurance, and pain limiting ability to complete self-care tasks.     Vision   Additional Comments: vc to keep eyes open during session   Perception     Praxis      Cognition Arousal/Alertness: Awake/alert;Lethargic Behavior During Therapy: Flat  affect Overall Cognitive Status: Impaired/Different from baseline                   Orientation Level: Person;Place;Situation (unaware of being at the hospital - but could ID daughters when they came) Current Attention Level: Sustained Memory: Decreased short-term memory Following Commands:  Follows one step commands with increased time Safety/Judgement: Decreased awareness of deficits;Decreased awareness of safety Awareness: Intellectual Problem Solving: Difficulty sequencing;Requires verbal cues;Requires tactile cues General Comments: fearful, requiring extra time and multimodal cues for participation, unaware of where he was        Exercises     Shoulder Instructions       General Comments Pt desatured on RA, used 3L throughout session to maintain SpO2 >90%; sitter Lauren present throughout and daughters entered at the end    Pertinent Vitals/ Pain       Pain Assessment: Faces Faces Pain Scale: Hurts little more Pain Location: ribs with deep breathing Pain Descriptors / Indicators: Guarding;Grimacing;Discomfort;Sore Pain Intervention(s): Monitored during session;Repositioned  Home Living                                          Prior Functioning/Environment              Frequency  Min 2X/week        Progress Toward Goals  OT Goals(current goals can now be found in the care plan section)  Progress towards OT goals: Not progressing toward goals - comment (decrease in cognition since last session)  Acute Rehab OT Goals Patient Stated Goal: get to the chair OT Goal Formulation: With patient Time For Goal Achievement: 08/28/21 Potential to Achieve Goals: Good  Plan Discharge plan remains appropriate;Frequency remains appropriate    Co-evaluation    PT/OT/SLP Co-Evaluation/Treatment: Yes (fall since last seen) Reason for Co-Treatment: Necessary to address cognition/behavior during functional activity;For patient/therapist safety;To address functional/ADL transfers          AM-PAC OT "6 Clicks" Daily Activity     Outcome Measure   Help from another person eating meals?: A Little Help from another person taking care of personal grooming?: A Lot Help from another person toileting, which includes using toliet, bedpan, or  urinal?: A Lot Help from another person bathing (including washing, rinsing, drying)?: A Lot Help from another person to put on and taking off regular upper body clothing?: A Lot Help from another person to put on and taking off regular lower body clothing?: A Lot 6 Click Score: 13    End of Session Equipment Utilized During Treatment: Oxygen (3L O2)  OT Visit Diagnosis: Unsteadiness on feet (R26.81);Other abnormalities of gait and mobility (R26.89);Muscle weakness (generalized) (M62.81);Pain Pain - Right/Left: Right Pain - part of body:  (Ribs)   Activity Tolerance Patient tolerated treatment well (impacted by cognition)   Patient Left in chair;with call bell/phone within reach;with family/visitor present;with nursing/sitter in room;Other (comment) (posey belt)   Nurse Communication Mobility status;Precautions        Time: 9450-3888 OT Time Calculation (min): 29 min  Charges: OT General Charges $OT Visit: 1 Visit OT Treatments $Self Care/Home Management : 8-22 mins  Nyoka Cowden OTR/L Acute Rehabilitation Services Pager: [phone] Office: [phone]   Evern Bio Marylyn Appenzeller 08/17/2021, 11:50 AM

## 2021-08-17 NOTE — Consult Note (Signed)
Consultation Note Date: 08/17/2021   Patient Name: [name] [name]  DOB: June 05, 1935  MRN: [id number]  Age / Sex: 86 y.o., male  PCP: Imagene Riches, NP Referring Physician: Axel Filler, *  Reason for Consultation: Establishing goals of care  HPI/Patient Profile: 86 y.o. male  with past medical history of A fib, CHF, COPD, CAD, HLD, HTN, GERD, and depression admitted on 08/12/2021 with a fall that resulted in multiple rib fractures and spinal fracture. Hospital course has been complicated by delirium - forcefully removed catheter and now has hematuria. Patient now more lethargic and concern for aspiration pneumonia. PMT consulted to discuss [place].   Clinical Assessment and Goals of Care: I have reviewed medical records including EPIC notes, labs and imaging, received report from RN and Dr. Evette Doffing, assessed the patient and then met with patient's 2 daughters, Freda Munro and Claiborne Billings,  to discuss diagnosis prognosis, GOC, EOL wishes, disposition and options.  I introduced Palliative Medicine as specialized medical care for people living with serious illness. It focuses on providing relief from the symptoms and stress of a serious illness. The goal is to improve quality of life for both the patient and the family.  Family shares patient has been living at home alone. They share that he is stubborn and was not open to help or extra support in the home. They tell me about home PT and home RNs coming to the home but patient turned them away; he wanted to be left alone. They share that he has had multiple falls at home prior to fall that brought him to the hospital. They tells me about a decline in his function and difficulty caring for himself. He has to use a walker to ambulate. They tell me about significant decline in appetite. They share about cognitive decline as well. They tell me he has started to have more bad days than good. They share that his  general mantra about life is "Let it be".    We discussed patient's current illness and what it means in the larger context of patient's on-going co-morbidities.  Natural disease trajectory and expectations at EOL were discussed. Discussed decline in function, nutrition, and cognition. Discussed multiple falls at home. Discussed progressive chronic nature of dementia. Discussed hospital delirium is difficult to recover from and can lead to poor prognosis. Discussed new concern of aspiration and dysphagia.   I attempted to elicit values and goals of care important to the patient. Family shares patient has always been concerned about loss of independence and would never want to live in an nursing home.  We discussed that if patient survives hospitalization he will likely require nursing home care as family is unable to care for him at home. Discussed that he will likely not return to baseline - will have a new baseline worse than before following this hospitalization with significant delirium.     The difference between aggressive medical intervention and comfort care was considered in light of the patient's goals of care. Both daughters are open to pursuing comfort path only but would like to take time to consider.   Discussed with family the importance of continued conversation with family and the medical providers regarding overall plan of care and treatment options, ensuring decisions are within the context of the patient's values and GOCs.    Hospice services outpatient were explained and offered.  Discussed plan to continue current care for now and follow up tomorrow. They agree.  Questions and concerns were addressed. The  family was encouraged to call with questions or concerns.   Primary Decision Maker NEXT OF KIN - daughters Freda Munro and Claiborne Billings    SUMMARY OF RECOMMENDATIONS   - continue current care for at least another 24 hours as daughters consider options, comfort measures only/hospice  were discussed and daughters seem open to this as they feel patient would not want to continue current measures only to go live in nursing home - he would consider this poor quality of life - PMT will follow up 9/2  Code Status/Advance Care Planning: DNR  Symptom Management:  Continue seroquel qhs per IMTS   Discharge Planning: To Be Determined      Primary Diagnoses: Present on Admission:  Fracture of multiple ribs  Closed compression fracture of body of L1 vertebra (HCC)  Fall  Atrial fibrillation ([place])  Acute on chronic systolic heart failure (HCC)  Acute encephalopathy   I have reviewed the medical record, interviewed the patient and family, and examined the patient. The following aspects are pertinent.  Past Medical History:  Diagnosis Date   Arrhythmia    chronic atrial fibrillation   Arthritis    Atrial fibrillation (HCC)    chronic atrial fibrillation    CHF (congestive heart failure) (HCC)    most recent EF 50-55%   COPD (chronic obstructive pulmonary disease) (HCC)    chronic   Coronary artery disease    Depression    Dizziness    Fatigue    Heart murmur    Hernia NEC    hiatel   Hyperlipidemia    Hypertension    Reflux    Scarlet fever    Social History   Socioeconomic History   Marital status: Divorced    Spouse name: Not on file   Number of children: Not on file   Years of education: Not on file   Highest education level: Not on file  Occupational History   Not on file  Tobacco Use   Smoking status: Former    Packs/day: 1.50    Years: 20.00    Pack years: 30.00    Types: Cigarettes    Quit date: 12/17/1980    Years since quitting: 40.6   Smokeless tobacco: Never  Substance and Sexual Activity   Alcohol use: Not Currently    Comment: has history of alcohol use but no drink since 10 years   Drug use: No   Sexual activity: Not Currently  Other Topics Concern   Not on file  Social History Narrative   Not on file   Social  Determinants of Health   Financial Resource Strain: Not on file  Food Insecurity: Not on file  Transportation Needs: Not on file  Physical Activity: Not on file  Stress: Not on file  Social Connections: Not on file   Family History  Problem Relation Age of Onset   Cancer Mother    Heart attack Father    Heart attack Brother    Cancer Brother    Scheduled Meds:  acetaminophen  1,000 mg Oral Q8H   aspirin EC  81 mg Oral Daily   Chlorhexidine Gluconate Cloth  6 each Topical Q0600   enoxaparin (LOVENOX) injection  40 mg Subcutaneous Q24H   fluticasone furoate-vilanterol  1 puff Inhalation Daily   furosemide  40 mg Oral Daily   losartan  25 mg Oral Daily   methocarbamol  1,000 mg Oral Q8H   potassium chloride  40 mEq Oral Once   QUEtiapine  25 mg  Oral QHS   sodium chloride flush  3 mL Intravenous Q12H   tamsulosin  0.4 mg Oral Daily   umeclidinium bromide  1 puff Inhalation Daily   Continuous Infusions:  sodium chloride     PRN Meds:.sodium chloride, acetaminophen, ipratropium-albuterol, menthol-cetylpyridinium, morphine injection, oxyCODONE, sodium chloride flush, sodium chloride irrigation No Known Allergies Review of Systems  Unable to perform ROS: Mental status change   Physical Exam Constitutional:      General: He is not in acute distress.    Comments: lethargic  Pulmonary:     Effort: Pulmonary effort is normal.  Skin:    General: Skin is warm and dry.  Neurological:     Mental Status: He is disoriented.    Vital Signs: BP (!) 118/45 (BP Location: Right Arm)   Pulse 80   Temp (!) 96.8 F (36 C) (Axillary)   Resp 20   Ht 5' 9"  (1.753 m)   Wt 105.7 kg   SpO2 93%   BMI 34.41 kg/m  Pain Scale: 0-10 POSS *See Group Information*: S-Acceptable,Sleep, easy to arouse Pain Score: Asleep   SpO2: SpO2: 93 % O2 Device:SpO2: 93 % O2 Flow Rate: .O2 Flow Rate (L/min): 3 L/min  IO: Intake/output summary:  Intake/Output Summary (Last 24 hours) at 08/17/2021  1223 Last data filed at 08/17/2021 0825 Gross per 24 hour  Intake 244 ml  Output 495 ml  Net -251 ml    LBM: Last BM Date:  (PTA) Baseline Weight: Weight: 99.8 kg Most recent weight: Weight: 105.7 kg     Palliative Assessment/Data: PPS 20% currently d/t AMS    Time Total: 80 minutes Greater than 50%  of this time was spent counseling and coordinating care related to the above assessment and plan.  Juel Burrow, DNP, AGNP-C Palliative Medicine Team [phone] Pager: [phone]

## 2021-08-17 NOTE — Progress Notes (Signed)
Physical Therapy Treatment Patient Details Name: [name] [name] MRN: [id number] DOB: [birth year] Today's Date: 08/17/2021    History of Present Illness 86 y.o. male admitted on 08/12/21 after a ground level fall sustaining R lateral 6th and 7th rib fxs, acute L1 compression fx (NSYG consult pending), bil pleural effusions.  Pt with significant PMH of chronic A fib, CHF, COPD, CAD, dizziness, HTN, AVR, CABG.    PT Comments    Pt lethargic upon arrival and needs repeated cues to open eyes to increase arousal. Pt willing to participate and motivated to get OOB to chair this date. He displays poor deficits and safety awareness and is disoriented. Pt requires extensive multi-modal cues and bil HHA modAx2 to perform transfers and ambulate short bedroom distances this date. He displays a posterior lean and difficulty sequencing weight shifting and advancing contralateral leg to ambulate. Will continue to follow acutely. Current recommendations remain appropriate.    Follow Up Recommendations  SNF;Supervision for mobility/OOB     Equipment Recommendations  Rolling walker with 5" wheels;3in1 (PT);Wheelchair (measurements PT);Wheelchair cushion (measurements PT);Hospital bed    Recommendations for Other Services       Precautions / Restrictions Precautions Precautions: Fall Precaution Comments: monitor Oxygen saturation; posey belt alarm; mittens Restrictions Weight Bearing Restrictions: No    Mobility  Bed Mobility Overal bed mobility: Needs Assistance Bed Mobility: Rolling;Sidelying to Sit Rolling: Max assist Sidelying to sit: Mod assist;+2 for physical assistance;+2 for safety/equipment;HOB elevated       General bed mobility comments: very lethargic initially and lots of multimodal cues for sequencing, maxA to roll and grab rail on L and modAx2 to ascend trunk and manage legs to sit up.    Transfers Overall transfer level: Needs assistance Equipment used: 2 person hand held  assist Transfers: Sit to/from [employer] Sit to Stand: Mod assist;+2 physical assistance;+2 safety/equipment Stand pivot transfers: Mod assist;+2 physical assistance       General transfer comment: PT/OT assisted with weight shifting for leg progression and movement along with verbal cues for sequencing. heavy BUE support and posterior lean initially  Ambulation/Gait Ambulation/Gait assistance: Mod assist;+2 physical assistance;+2 safety/equipment Gait Distance (Feet): 7 Feet (x2 bouts of ~7 ft > ~3 ft) Assistive device: 2 person hand held assist Gait Pattern/deviations: Step-through pattern;Decreased step length - right;Decreased step length - left;Decreased weight shift to right;Decreased weight shift to left;Decreased stride length;Shuffle;Leaning posteriorly Gait velocity: decr Gait velocity interpretation: <1.31 ft/sec, indicative of household ambulator General Gait Details: Pt with posterior lean, needing heavy bil HHA modAx2 to direct, facilitate weight shifting, and steady pt with anterior, posterior, and lateral short, shuffling steps this date.   Stairs             Wheelchair Mobility    Modified Rankin (Stroke Patients Only)       Balance Overall balance assessment: Needs assistance Sitting-balance support: Feet supported Sitting balance-Leahy Scale: Poor     Standing balance support: Bilateral upper extremity supported;During functional activity Standing balance-Leahy Scale: Poor Standing balance comment: requires B UE support, assistance for balance                            Cognition Arousal/Alertness: Awake/alert;Lethargic Behavior During Therapy: Flat affect Overall Cognitive Status: Impaired/Different from baseline                   Orientation Level: Person;Place;Situation (unaware of being at the hospital - but could ID daughters when they  came) Current Attention Level: Sustained Memory: Decreased short-term  memory Following Commands: Follows one step commands with increased time Safety/Judgement: Decreased awareness of deficits;Decreased awareness of safety Awareness: Intellectual Problem Solving: Difficulty sequencing;Requires verbal cues;Requires tactile cues General Comments: fearful, requiring extra time and multimodal cues for participation, unaware of where he was. Poor sequencing and awareness of deficits impacting his safety.      Exercises      General Comments General comments (skin integrity, edema, etc.): Pt desatured on RA, used 3L throughout session to maintain SpO2 >90%; sitter Lauren present throughout and daughters entered at the end      Pertinent Vitals/Pain Pain Assessment: Faces Faces Pain Scale: Hurts little more Pain Location: ribs with deep breathing Pain Descriptors / Indicators: Guarding;Grimacing;Discomfort;Sore Pain Intervention(s): Limited activity within patient's tolerance;Monitored during session;Repositioned    Home Living                      Prior Function            PT Goals (current goals can now be found in the care plan section) Acute Rehab PT Goals Patient Stated Goal: get to the chair PT Goal Formulation: With patient/family Time For Goal Achievement: 08/26/21 Potential to Achieve Goals: Fair Progress towards PT goals: Progressing toward goals    Frequency    Min 2X/week      PT Plan Current plan remains appropriate;Equipment recommendations need to be updated    Co-evaluation PT/OT/SLP Co-Evaluation/Treatment: Yes Reason for Co-Treatment: Necessary to address cognition/behavior during functional activity;For patient/therapist safety;To address functional/ADL transfers PT goals addressed during session: Mobility/safety with mobility;Balance        AM-PAC PT "6 Clicks" Mobility   Outcome Measure  Help needed turning from your back to your side while in a flat bed without using bedrails?: A Lot Help needed moving  from lying on your back to sitting on the side of a flat bed without using bedrails?: Total Help needed moving to and from a bed to a chair (including a wheelchair)?: Total Help needed standing up from a chair using your arms (e.g., wheelchair or bedside chair)?: Total Help needed to walk in hospital room?: Total Help needed climbing 3-5 steps with a railing? : Total 6 Click Score: 7    End of Session Equipment Utilized During Treatment: Gait belt;Oxygen Activity Tolerance: Patient limited by fatigue Patient left: in chair;with call bell/phone within reach;with family/visitor present;with nursing/sitter in room;Other (comment) (with posey alarm belt in place and on) Nurse Communication: Mobility status PT Visit Diagnosis: Muscle weakness (generalized) (M62.81);Difficulty in walking, not elsewhere classified (R26.2);Pain;History of falling (Z91.81);Unsteadiness on feet (R26.81);Other abnormalities of gait and mobility (R26.89) Pain - Right/Left: Right Pain - part of body:  (ribs)     Time: 1540-0867 PT Time Calculation (min) (ACUTE ONLY): 29 min  Charges:  $Gait Training: 8-22 mins                     Raymond Gurney, PT, DPT Acute Rehabilitation Services  Pager: [phone] Office: [phone]    Jewel Baize 08/17/2021, 2:43 PM

## 2021-08-17 NOTE — Progress Notes (Addendum)
HD#5 SUBJECTIVE:  Patient Summary: [name] [name] is a 86 y.o. with a pertinent PMH of A fib, CHF, COPD, CAD, HLD, HTN, GERD and depression, who presented from OSH after a fall that resulted in multiple rib fractures and spinal fracture on hospital day 5 Admitted for Altered Mental Status.   Overnight Events: Temperature of 102.6 yesterday evening, given rectal tylenol.    Interim History: Spoke with patient's nurse and they stated that patient seems to have difficulty managing secretions.  OBJECTIVE:  Vital Signs: Vitals:   08/16/21 1948 08/16/21 2139 08/17/21 0005 08/17/21 0611  BP: (!) 136/38  123/66 (!) 117/46  Pulse: 89  74 76  Resp: (!) 27  18 20   Temp: (!) 100.9 F (38.3 C) 98.9 F (37.2 C) 98 F (36.7 C) 97.9 F (36.6 C)  TempSrc: Axillary Axillary Axillary Axillary  SpO2: 94%  92% 94%  Weight:      Height:       Supplemental O2: Nasal Cannula SpO2: 94 % O2 Flow Rate (L/min): 3 L/min FiO2 (%): 28 %  Filed Weights   08/13/21 0407 08/14/21 0505 08/16/21 0500  Weight: 103.5 kg 102.7 kg 105.7 kg     Intake/Output Summary (Last 24 hours) at 08/17/2021 0657 Last data filed at 08/17/2021 10/17/2021 Gross per 24 hour  Intake 120 ml  Output 495 ml  Net -375 ml   Net IO Since Admission: -3,869 mL [08/17/21 0657]  Physical Exam: General: in no acute distress, in hand mittens, slow to answer questions, on nasal cannula HEENT: NCAT, no rashes noted, gurgling sound noted in throat Eyes: no scleral icterus, conjunctiva clear CV: No murmurs, rubs, or gallops, normal rate Pulm: rhonchi in R>L, pulmonary effort normal GI: no tenderness, bowel sounds present GU: foley catheter in place Skin: warm and dry  Patient Lines/Drains/Airways Status     Active Line/Drains/Airways     Name Placement date Placement time Site Days   Peripheral IV 08/15/21 20 G 2.5" Left;Proximal Forearm 08/15/21  2305  Forearm  2   Urethral Catheter urology Cou[place] 08/16/21  1900  Coude  1   Wound /  Incision (Open or Dehisced) 08/12/21 (MASD) Moisture Associated Skin Damage Groin Right;Left rash 08/12/21  1500  Groin  5   Wound / Incision (Open or Dehisced) 08/15/21 Other (Comment);Non-pressure wound Head Posterior 08/15/21  1910  Head  2            Pertinent Labs: CBC Latest Ref Rng & Units 08/14/2021 08/13/2021 08/12/2021  WBC 4.0 - 10.5 K/uL 13.2(H) 11.0(H) 13.3(H)  Hemoglobin 13.0 - 17.0 g/dL 11.2(L) 10.7(L) 11.0(L)  Hematocrit 39.0 - 52.0 % 35.8(L) 34.2(L) 35.0(L)  Platelets 150 - 400 K/uL 193 198 215    CMP Latest Ref Rng & Units 08/17/2021 08/16/2021 08/15/2021  Glucose 70 - 99 mg/dL 08/17/2021) 98 93  BUN 8 - 23 mg/dL 606(T) 01(S) 01(U)  Creatinine 0.61 - 1.24 mg/dL 93(A) 3.55(D) 3.22(G)  Sodium 135 - 145 mmol/L 129(L) 128(L) 131(L)  Potassium 3.5 - 5.1 mmol/L 4.0 3.9 4.0  Chloride 98 - 111 mmol/L 90(L) 91(L) 92(L)  CO2 22 - 32 mmol/L 27 29 27   Calcium 8.9 - 10.3 mg/dL 8.3(L) 8.4(L) 8.7(L)  Total Protein 6.5 - 8.1 g/dL - - -  Total Bilirubin 0.3 - 1.2 mg/dL - - -  Alkaline Phos 38 - 126 U/L - - -  AST 15 - 41 U/L - - -  ALT 0 - 44 U/L - - -  No results for input(s): GLUCAP in the last 72 hours.     ASSESSMENT/PLAN:   #Acute Metabolic Encephalopathy #Urine retention #Aspiration pneumonitis/pneumonia Patient has had worsening mental status over the past 2-3 weeks per daughter.  Thought to be delirium due to urinary retention.  Patient has continued to be agitated and confused in hospital.  Was Given Seroquel (QTC 454) and ativan due to aggressive behavior.  Patient has been drowsy since then.   Foley catheter placed 08/31 by urology and patient has continued to be drowsy.  He has a fever of 102.6 F overnight that resolved with tylenol.  On exam, patient does not seem to be managing his secretions and may have aspirated.  Will start antibiotic and blood culture and urine culture have been ordered. Plan: -Start Unasyn 3g q12 hours -Follow blood cultures and urine  culture -Monitor pain but hold pain meds due to lethargy -Foley cath placed by urology 08/31  -urine output of 495 mL and net negative of 375 mL -Continue home Lasix 40 mg daily -Seroquel 25 mg qhs; Qtc 454; Will keep K>4, Mg>2 -Palliative care consulted, planning for limited trial of antibiotics, quality of life important to the patient and family  #Rib fracture/ spinal fracture 2/2 fall Multiple rib fractures with imaging showing non-displaced right lateral 7th, mildly displaced right lateral 6th rib fractures and L1 compression fracture. Plan: -Trauma surgery recommends pain control, pulmonary toilet, incentive spirometry, NSGY consult placed  #Atrial fibrillation Past medical history of afib, but is not on anticoagulation per family's preferences as he has frequent falls.  Not on beta blocker due to family preference Plan: -Continue to monitor as HR stays <100  Acute on chronic systolicCHF Patient has CHF with EF of 30-35% with severe dilation of left atrium. Currently on home lasix 40 mg daily. His BNP was 1,193 on 08/12/21. Plan: -PO lasix 80 mg 08/14/21 and then home dose of 40 mg starting 08/15/21                         -urine output of 495 mL and net negative of 3,775 mL. #Hematuria: Urology successfully placed foley catheter.  Patient remains in hand mittens.  Urine output since placement of 495 mL Plan: -Continue to monitor   Chronic Problems: #COPD Patient has COPD with extensive smoking history. He has not been using his inhalers regularly per the daughter.    Plan: -Duonebs q 4hrs prn -Breo and Incruse -Remains on [place] at 2L   #Hypertension: Patient has hypertension on Losartan 25 mg. Currently 112/51.    Plan: -Continue home Losartan 25 mg daily -Continue to monitor     Best Practice: Diet: Cardiac diet IVF: Fluids: none VTE: enoxaparin (LOVENOX) injection 40 mg Start: 08/12/21 1245 Code: DNR Therapy Recs:  patient is not currently able to participate  in therapy , DME: none Family Contact: Lexine Baton to be notified. DISPO: Anticipated discharge dependent on improvement of encephalopathy  Signature: [employer], D.O.  Internal Medicine Resident, PGY-1 [facility]  Pager: [phone] 6:57 AM, 08/17/2021   Please contact the on call pager after 5 pm and on weekends at [phone].    Internal Medicine Attending:   I saw and examined the patient. I reviewed the resident's note and I agree with the resident's findings and plan as documented in the resident's note.  Erlinda Hong, MD

## 2021-08-17 NOTE — Progress Notes (Signed)
PHARMACY - PHYSICIAN COMMUNICATION CRITICAL VALUE ALERT - BLOOD CULTURE IDENTIFICATION (BCID)  [name] [name] is an 86 y.o. male who presented to [facility] on 08/12/2021 with a chief complaint of altered mental status.   Assessment:  Per notes, patient has had worsening mental status over past 2-3 weeks. Has been struggling to manage his secretions and may have aspirated. WBC on last check 8/29 13.2, Tmax in 24 hr 100.9. 1/4 Bcx growing GPC - BCID showing staph species.   Name of physician (or Provider) Contacted: Dr Sharene Butters  Current antibiotics: Unasyn  Changes to prescribed antibiotics recommended:  Likely contaminant - will continue Unasyn for aspiration coverage this time and no further abx adjustments.   Results for orders placed or performed during the hospital encounter of 08/12/21  Blood Culture ID Panel (Reflexed) (Collected: 08/16/2021  7:51 PM)  Result Value Ref Range   Enterococcus faecalis NOT DETECTED NOT DETECTED   Enterococcus Faecium NOT DETECTED NOT DETECTED   Listeria monocytogenes NOT DETECTED NOT DETECTED   Staphylococcus species DETECTED (A) NOT DETECTED   Staphylococcus aureus (BCID) NOT DETECTED NOT DETECTED   Staphylococcus epidermidis NOT DETECTED NOT DETECTED   Staphylococcus lugdunensis NOT DETECTED NOT DETECTED   Streptococcus species NOT DETECTED NOT DETECTED   Streptococcus agalactiae NOT DETECTED NOT DETECTED   Streptococcus pneumoniae NOT DETECTED NOT DETECTED   Streptococcus pyogenes NOT DETECTED NOT DETECTED   A.calcoaceticus-baumannii NOT DETECTED NOT DETECTED   Bacteroides fragilis NOT DETECTED NOT DETECTED   Enterobacterales NOT DETECTED NOT DETECTED   Enterobacter cloacae complex NOT DETECTED NOT DETECTED   Escherichia coli NOT DETECTED NOT DETECTED   Klebsiella aerogenes NOT DETECTED NOT DETECTED   Klebsiella oxytoca NOT DETECTED NOT DETECTED   Klebsiella pneumoniae NOT DETECTED NOT DETECTED   Proteus species NOT DETECTED NOT DETECTED    Salmonella species NOT DETECTED NOT DETECTED   Serratia marcescens NOT DETECTED NOT DETECTED   Haemophilus influenzae NOT DETECTED NOT DETECTED   Neisseria meningitidis NOT DETECTED NOT DETECTED   Pseudomonas aeruginosa NOT DETECTED NOT DETECTED   Stenotrophomonas maltophilia NOT DETECTED NOT DETECTED   Candida albicans NOT DETECTED NOT DETECTED   Candida auris NOT DETECTED NOT DETECTED   Candida glabrata NOT DETECTED NOT DETECTED   Candida krusei NOT DETECTED NOT DETECTED   Candida parapsilosis NOT DETECTED NOT DETECTED   Candida tropicalis NOT DETECTED NOT DETECTED   Cryptococcus neoformans/gattii NOT DETECTED NOT DETECTED    Sherron Monday, PharmD, BCCCP Clinical Pharmacist  Phone: [phone] 08/17/2021 6:07 PM  Please check AMION for all [facility] Pharmacy phone numbers After 10:00 PM, call Main Pharmacy [phone]

## 2021-08-17 NOTE — TOC Progression Note (Signed)
Transition of Care [facility]) - Progression Note    Patient Details  Name: [name] [name] MRN: [id number] Date of Birth: Apr 03, 1935  Transition of Care [facility]) CM/SW Contact  Terrial Rhodes, LCSWA Phone Number: 08/17/2021, 2:39 PM  Clinical Narrative:      Palliative is following. CSW will continue to follow and assist with dc planning needs.  Expected Discharge Plan: Skilled Nursing Facility Barriers to Discharge: Continued Medical Work up  Expected Discharge Plan and Services Expected Discharge Plan: Skilled Nursing Facility In-house Referral: Clinical Social Work     Living arrangements for the past 2 months: Single Family Home                                       Social Determinants of Health (SDOH) Interventions    Readmission Risk Interventions No flowsheet data found.

## 2021-08-17 NOTE — Care Management Important Message (Signed)
Important Message  Patient Details  Name: [name] [name] MRN: [id number] Date of Birth: 18-Apr-1935   Medicare Important Message Given:        Renie Ora 08/17/2021, 9:27 AM

## 2021-08-18 DIAGNOSIS — Z515 Encounter for palliative care: Secondary | ICD-10-CM

## 2021-08-18 DIAGNOSIS — J9691 Respiratory failure, unspecified with hypoxia: Secondary | ICD-10-CM

## 2021-08-18 DIAGNOSIS — R451 Restlessness and agitation: Secondary | ICD-10-CM

## 2021-08-18 LAB — BASIC METABOLIC PANEL
Anion gap: 11 (ref 5–15)
BUN: 49 mg/dL — ABNORMAL HIGH (ref 8–23)
CO2: 27 mmol/L (ref 22–32)
Calcium: 8.4 mg/dL — ABNORMAL LOW (ref 8.9–10.3)
Chloride: 91 mmol/L — ABNORMAL LOW (ref 98–111)
Creatinine, Ser: 1.85 mg/dL — ABNORMAL HIGH (ref 0.61–1.24)
GFR, Estimated: 35 mL/min — ABNORMAL LOW (ref >60)
Glucose, Bld: 106 mg/dL — ABNORMAL HIGH (ref 70–99)
Potassium: 3.6 mmol/L (ref 3.5–5.1)
Sodium: 129 mmol/L — ABNORMAL LOW (ref 135–145)

## 2021-08-18 LAB — CULTURE, BLOOD (ROUTINE X 2): Special Requests: ADEQUATE

## 2021-08-18 LAB — CBC
HCT: 32.4 % — ABNORMAL LOW (ref 39.0–52.0)
Hemoglobin: 10.8 g/dL — ABNORMAL LOW (ref 13.0–17.0)
MCH: 29.5 pg (ref 26.0–34.0)
MCHC: 33.3 g/dL (ref 30.0–36.0)
MCV: 88.5 fL (ref 80.0–100.0)
Platelets: 218 10*3/uL (ref 150–400)
RBC: 3.66 MIL/uL — ABNORMAL LOW (ref 4.22–5.81)
RDW: 13.9 % (ref 11.5–15.5)
WBC: 9.4 10*3/uL (ref 4.0–10.5)
nRBC: 0 % (ref 0.0–0.2)

## 2021-08-18 MED ORDER — METHOCARBAMOL 500 MG PO TABS: 1000.0000 mg | ORAL_TABLET | Freq: Three times a day (TID) | ORAL | Status: AC

## 2021-08-18 MED ORDER — LORAZEPAM 2 MG/ML PO CONC: 1.0000 mg | ORAL | Status: DC | PRN

## 2021-08-18 MED ORDER — ONDANSETRON HCL 4 MG/2ML IJ SOLN: 4.0000 mg | Freq: Four times a day (QID) | INTRAMUSCULAR | Status: DC | PRN

## 2021-08-18 MED ORDER — HALOPERIDOL LACTATE 5 MG/ML IJ SOLN: 2.0000 mg | INTRAMUSCULAR | 0 refills | Status: AC | PRN

## 2021-08-18 MED ORDER — HYDROMORPHONE HCL 1 MG/ML IJ SOLN: 0.2500 mg | INTRAMUSCULAR | 0 refills | Status: AC | PRN

## 2021-08-18 MED ORDER — HALOPERIDOL 1 MG PO TABS
2.0000 mg | ORAL_TABLET | ORAL | Status: DC | PRN
  Filled 2021-08-18: qty 2

## 2021-08-18 MED ORDER — ACETAMINOPHEN 500 MG PO TABS: 1000.0000 mg | ORAL_TABLET | Freq: Three times a day (TID) | ORAL | 0 refills | Status: AC

## 2021-08-18 MED ORDER — OXYCODONE HCL 5 MG PO TABS: 5.0000 mg | ORAL_TABLET | ORAL | 0 refills | Status: AC | PRN

## 2021-08-18 MED ORDER — QUETIAPINE FUMARATE 25 MG PO TABS: 25.0000 mg | ORAL_TABLET | Freq: Every day | ORAL | Status: AC

## 2021-08-18 MED ORDER — ONDANSETRON 4 MG PO TBDP: 4.0000 mg | ORAL_TABLET | Freq: Four times a day (QID) | ORAL | 0 refills | Status: AC | PRN

## 2021-08-18 MED ORDER — BIOTENE DRY MOUTH MT LIQD: 15.0000 mL | OROMUCOSAL | Status: DC | PRN

## 2021-08-18 MED ORDER — GLYCOPYRROLATE 1 MG PO TABS
1.0000 mg | ORAL_TABLET | ORAL | Status: DC | PRN
  Filled 2021-08-18: qty 1

## 2021-08-18 MED ORDER — GLYCOPYRROLATE 0.2 MG/ML IJ SOLN
0.2000 mg | INTRAMUSCULAR | Status: DC | PRN
  Administered 2021-08-18: 0.2 mg via INTRAVENOUS
  Filled 2021-08-18: qty 1

## 2021-08-18 MED ORDER — POLYVINYL ALCOHOL 1.4 % OP SOLN: 1.0000 [drp] | Freq: Four times a day (QID) | OPHTHALMIC | Status: DC | PRN

## 2021-08-18 MED ORDER — LORAZEPAM 1 MG PO TABS: 1.0000 mg | ORAL_TABLET | ORAL | 0 refills | Status: AC | PRN

## 2021-08-18 MED ORDER — LORAZEPAM 1 MG PO TABS: 1.0000 mg | ORAL_TABLET | ORAL | Status: DC | PRN

## 2021-08-18 MED ORDER — HALOPERIDOL LACTATE 2 MG/ML PO CONC
2.0000 mg | ORAL | Status: DC | PRN
  Filled 2021-08-18: qty 1

## 2021-08-18 MED ORDER — GLYCOPYRROLATE 0.2 MG/ML IJ SOLN: 0.2000 mg | INTRAMUSCULAR | Status: DC | PRN

## 2021-08-18 MED ORDER — HALOPERIDOL 2 MG PO TABS: 2.0000 mg | ORAL_TABLET | ORAL | 0 refills | Status: AC | PRN

## 2021-08-18 MED ORDER — ONDANSETRON 4 MG PO TBDP
4.0000 mg | ORAL_TABLET | Freq: Four times a day (QID) | ORAL | Status: DC | PRN
  Filled 2021-08-18: qty 1

## 2021-08-18 MED ORDER — HALOPERIDOL LACTATE 5 MG/ML IJ SOLN: 2.0000 mg | INTRAMUSCULAR | Status: DC | PRN

## 2021-08-18 MED ORDER — LORAZEPAM 2 MG/ML IJ SOLN
1.0000 mg | INTRAMUSCULAR | Status: DC | PRN
  Administered 2021-08-18: 1 mg via INTRAVENOUS
  Filled 2021-08-18: qty 1

## 2021-08-18 MED ORDER — HYDROMORPHONE HCL 1 MG/ML IJ SOLN: 0.2500 mg | INTRAMUSCULAR | Status: DC | PRN

## 2021-08-18 MED ORDER — ACETAMINOPHEN 650 MG RE SUPP: 650.0000 mg | Freq: Four times a day (QID) | RECTAL | 0 refills | Status: AC | PRN

## 2021-08-18 MED ORDER — GLYCOPYRROLATE 1 MG PO TABS: 1.0000 mg | ORAL_TABLET | ORAL | Status: AC | PRN

## 2021-08-18 MED ORDER — DIPHENHYDRAMINE HCL 25 MG PO CAPS
25.0000 mg | ORAL_CAPSULE | Freq: Once | ORAL | Status: AC
  Administered 2021-08-18: 25 mg via ORAL
  Filled 2021-08-18: qty 1

## 2021-08-18 NOTE — Discharge Summary (Addendum)
Name: [name] [name] MRN: [id number] DOB: [birth year] 86 y.o. PCP: Dema Severin, NP  Date of Admission: 08/12/2021  4:26 AM Date of Discharge: 08/18/2021 Attending Physician: Tyson Alias, *  Discharge Diagnosis: 1. Acute Metabolic Encephalopathy 2. Advanced Dementia 3. Fall with 2 rib fractures and L1 compression fracture 4. Aspiration pneumonia 5. Urine retention with bladder stretch injury requiring foley catheter  Discharge Medications: Allergies as of 08/18/2021   No Known Allergies      Medication List     STOP taking these medications    albuterol (2.5 MG/3ML) 0.083% nebulizer solution Commonly known as: PROVENTIL   aspirin 81 MG tablet   furosemide 40 MG tablet Commonly known as: LASIX   losartan 25 MG tablet Commonly known as: COZAAR   potassium chloride 10 MEQ CR tablet Commonly known as: KLOR-CON   Trelegy Ellipta 100-62.5-25 MCG/INH Aepb Generic drug: Fluticasone-Umeclidin-Vilant   vitamin B-12 1000 MCG tablet Commonly known as: CYANOCOBALAMIN       TAKE these medications    acetaminophen 650 MG suppository Commonly known as: TYLENOL Place 1 suppository (650 mg total) rectally every 6 (six) hours as needed for fever.   acetaminophen 500 MG tablet Commonly known as: TYLENOL Take 2 tablets (1,000 mg total) by mouth every 8 (eight) hours.   glycopyrrolate 1 MG tablet Commonly known as: ROBINUL Take 1 tablet (1 mg total) by mouth every 4 (four) hours as needed (excessive secretions).   haloperidol 2 MG tablet Commonly known as: HALDOL Take 1 tablet (2 mg total) by mouth every 4 (four) hours as needed for agitation (or delirium).   haloperidol lactate 5 MG/ML injection Commonly known as: HALDOL Inject 0.4 mLs (2 mg total) into the vein every 4 (four) hours as needed (or delirium).   HYDROmorphone 1 MG/ML injection Commonly known as: DILAUDID Inject 0.25 mLs (0.25 mg total) into the vein every hour as needed for severe pain.    LORazepam 1 MG tablet Commonly known as: ATIVAN Take 1 tablet (1 mg total) by mouth every 4 (four) hours as needed for anxiety.   methocarbamol 500 MG tablet Commonly known as: ROBAXIN Take 2 tablets (1,000 mg total) by mouth every 8 (eight) hours.   ondansetron 4 MG disintegrating tablet Commonly known as: ZOFRAN-ODT Take 1 tablet (4 mg total) by mouth every 6 (six) hours as needed for nausea.   oxyCODONE 5 MG immediate release tablet Commonly known as: Oxy IR/ROXICODONE Take 1 tablet (5 mg total) by mouth every 4 (four) hours as needed for severe pain.   QUEtiapine 25 MG tablet Commonly known as: SEROQUEL Take 1 tablet (25 mg total) by mouth at bedtime.               Discharge Care Instructions  (From admission, onward)           Start     Ordered   08/18/21 0000  Discharge wound care:       Comments: Per RN   08/18/21 1427            Disposition:   [name] [name] was discharged from [facility] in Stable condition.  Transitioning to hospice care at [facility] house:  Hospital Course by problem list: 1. Acute Metabolic Encephalopathy/ Urine retention/ Aspiration pneumonitis Patient with past medical history of dementia presented to OSH due to two weeks of increased confusion and falling.  Found to have broken 2 of his ribs and had an L1 compression fracture.  He has continued to have increased confusion during hospital course.  Patient developed fever and did not seem to be managing secretions well.  Concern for aspiration pneumonitis and antibiotics were initiated.  Palliative medicine was consulted and conversations initiated with daughters on goals of care.  With antibiotics on board, patient cognition improved a small amount.  However with continued state of confusion daughters elected to proceed with hospice.  Patient to be discharged to [facility] house.  Please continue the Foley catheter indefinitely for comfort  care.  Discharge Exam:   BP (!) 145/47 (BP Location: Right Arm)   Pulse 90   Temp 97.6 F (36.4 C) (Oral)   Resp 14   Ht 5\' 9"  (1.753 m)   Wt 105.7 kg   SpO2 93%   BMI 34.41 kg/m  Discharge exam:  General: Chronically ill-appearing elderly man HEENT: NCAT, no rashes Eyes: matting of eyelashes present, conjunctiva clear CV: No murmurs, rubs, or gallops, normal rate Pulm: CTAB, pulmonary effort normal  GI: No tenderness, bowel sounds present MSK: No peripheral edema of lower extremities bilaterally  Pertinent Labs, Studies, and Procedures:  CBC Latest Ref Rng & Units 08/18/2021 08/14/2021 08/13/2021  WBC 4.0 - 10.5 K/uL 9.4 13.2(H) 11.0(H)  Hemoglobin 13.0 - 17.0 g/dL 10.8(L) 11.2(L) 10.7(L)  Hematocrit 39.0 - 52.0 % 32.4(L) 35.8(L) 34.2(L)  Platelets 150 - 400 K/uL 218 193 198    BMP Latest Ref Rng & Units 08/18/2021 08/17/2021 08/17/2021  Glucose 70 - 99 mg/dL 10/17/2021) 970(Y) 637(C)  BUN 8 - 23 mg/dL 588(F) 02(D) 74(J)  Creatinine 0.61 - 1.24 mg/dL 28(N) 8.67(E) 7.20(N)  BUN/Creat Ratio 10 - 24 - - -  Sodium 135 - 145 mmol/L 129(L) 129(L) 129(L)  Potassium 3.5 - 5.1 mmol/L 3.6 3.8 4.0  Chloride 98 - 111 mmol/L 91(L) 92(L) 90(L)  CO2 22 - 32 mmol/L 27 26 27   Calcium 8.9 - 10.3 mg/dL 4.70(J) ) 8.3(L)    CXR 08/16/21: IMPRESSION: Cardiomegaly with vascular congestion. Small bilateral pleural effusions and bibasilar atelectasis or infiltrate. CT head 08/15/21: Motion degraded images. No evidence of acute intracranial abnormality. Small vessel ischemic changes. CT abd/pelvis 08/18/21:   IMPRESSION: 1. Right lateral 6th and 7th rib fractures redemonstrated. Small layering pleural effusions right > left are stable since 0004 hours today, and simple fluid density argues in favor of transudate and against hemothorax. 2. Acute appearing L1 compression fracture with 35% loss of vertebral body height. No retropulsion or other complicating features. 3. No other acute traumatic  injury identified in the non-contrast abdomen or pelvis. 4. Cardiomegaly. Large bowel diverticulosis. Prostatomegaly. Aortic Atherosclerosis (ICD10-I70.0). Discharge Instructions: Discharge Instructions     Diet - low sodium heart healthy   Complete by: As directed    Discharge wound care:   Complete by: As directed    Per RN       Signed: Ruhaan Nordahl, 08/17/21, DO 08/18/2021, 2:28 PM   Pager: [phone]

## 2021-08-18 NOTE — Progress Notes (Signed)
   I have received referral from [facility] NP Shae for hospice services at the [facility] house. I have spoken to the family both daughters. They are in agreement and they have completed the paperwork for pt to go to the [facility] hospice house. We are able to accomodateshim today and family is in agreement. Thank you for this referral.   Norm Parcel RN [phone]

## 2021-08-18 NOTE — TOC Transition Note (Addendum)
Transition of Care [facility]) - CM/SW Discharge Note   Patient Details  Name: [name] [name] MRN: [id number] Date of Birth: 29-Apr-1935  Transition of Care [facility]) CM/SW Contact:  Terrial Rhodes, LCSWA Phone Number: 08/18/2021, 2:44 PM   Clinical Narrative:     Patient will DC to: [facility]   Anticipated DC date: 08/18/2021  Family notified: Velna Hatchet   Transport by: Sharin Mons  ?  Per MD patient ready for DC to [facility] . RN, patient, Cheri with [facility], patient's family, and facility notified of DC. RN given number for report tele# [phone]. DC packet on chart. DNR signed by MD attached to patients DC packet.Ambulance transport requested for patient.  CSW signing off.   Final next level of care: Hospice Medical Facility [facility]) Barriers to Discharge: No Barriers Identified   Patient Goals and CMS Choice   CMS Medicare.gov Compare Post Acute Care list provided to:: Patient Represenative (must comment) Velna Hatchet daughter) Choice offered to / list presented to : Adult Children Velna Hatchet daughter)  Discharge Placement              Patient chooses bed at:  [facility]) Patient to be transferred to facility by: [facility] Name of family member notified: Velna Hatchet Patient and family notified of of transfer: 08/18/21  Discharge Plan and Services In-house Referral: Clinical Social Work                                   Social Determinants of Health (SDOH) Interventions     Readmission Risk Interventions No flowsheet data found.

## 2021-08-18 NOTE — Progress Notes (Signed)
Pt extremely restless & agitated all night. Slept for only about an hour the entire shift. Removed one IV. Constantly pulling at lines/foley & removing mittens. Unable to redirect or console & required constant 1 on 1 monitoring. By end of shift pt became violent and began grabbing, squeezing and trying to bend my fingers when I was attempting to replace mittens. On call MD notified & came to bedside to assess pt. Dayshift RN updated.

## 2021-08-18 NOTE — Progress Notes (Signed)
   08/16/21 1602  Assess: MEWS Score  Temp (!) 100.4 F (38 C)  BP (!) 156/65  Pulse Rate 93  ECG Heart Rate 94  Resp (!) 26  SpO2 94 %  O2 Device Nasal Cannula  Assess: MEWS Score  MEWS Temp 0  MEWS Systolic 0  MEWS Pulse 0  MEWS RR 2  MEWS LOC 2  MEWS Score 4  MEWS Score Color Red  Assess: if the MEWS score is Yellow or Red  Were vital signs taken at a resting state? Yes  Focused Assessment No change from prior assessment  Early Detection of Sepsis Score *See Row Information* Medium  MEWS guidelines implemented *See Row Information* Yes  Treat  MEWS Interventions Escalated (See documentation below)  Take Vital Signs  Increase Vital Sign Frequency  Red: Q 1hr X 4 then Q 4hr X 4, if remains red, continue Q 4hrs  Escalate  MEWS: Escalate Red: discuss with charge nurse/RN and provider, consider discussing with RRT  Notify: Charge Nurse/RN  Name of Charge Nurse/RN Notified Yolunda Kloos RN  Date Charge Nurse/RN Notified 08/16/21  Time Charge Nurse/RN Notified 1640

## 2021-08-18 NOTE — Progress Notes (Signed)
Daily Progress Note   Patient Name: [name] [name]       Date: 08/18/2021 DOB: Mar 16, 1935  Age: 86 y.o. MRN#: [id number] Attending Physician: Tyson Alias, * Primary Care Physician: Dema Severin, NP Admit Date: 08/12/2021  Reason for Consultation/Follow-up: Establishing goals of care  Subjective: Slightly more alert today, remains confused, family at bedside  Length of Stay: 6  Current Medications: Scheduled Meds:   acetaminophen  1,000 mg Oral Q8H   furosemide  40 mg Oral Daily   methocarbamol  1,000 mg Oral Q8H   QUEtiapine  25 mg Oral QHS   sodium chloride flush  3 mL Intravenous Q12H   tamsulosin  0.4 mg Oral Daily    Continuous Infusions:  sodium chloride      PRN Meds: sodium chloride, acetaminophen, antiseptic oral rinse, glycopyrrolate **OR** glycopyrrolate **OR** glycopyrrolate, haloperidol **OR** haloperidol **OR** haloperidol lactate, HYDROmorphone (DILAUDID) injection, ipratropium-albuterol, LORazepam **OR** LORazepam **OR** LORazepam, ondansetron **OR** ondansetron (ZOFRAN) IV, oxyCODONE, polyvinyl alcohol, sodium chloride flush, sodium chloride irrigation  Physical Exam Constitutional:      Comments: lethargic  Pulmonary:     Effort: Pulmonary effort is normal.  Skin:    General: Skin is warm and dry.  Neurological:     Mental Status: He is disoriented.            Vital Signs: BP (!) 145/47 (BP Location: Right Arm)   Pulse 90   Temp 97.6 F (36.4 C) (Oral)   Resp 14   Ht 5\' 9"  (1.753 m)   Wt 105.7 kg   SpO2 93%   BMI 34.41 kg/m  SpO2: SpO2: 93 % O2 Device: O2 Device: Room Air O2 Flow Rate: O2 Flow Rate (L/min): 3 L/min  Intake/output summary:  Intake/Output Summary (Last 24 hours) at 08/18/2021 1531 Last data filed at 08/18/2021 1003 Gross per 24  hour  Intake 460 ml  Output 975 ml  Net -515 ml   LBM: Last BM Date:  (PTA) Baseline Weight: Weight: 99.8 kg Most recent weight: Weight: 105.7 kg       Palliative Assessment/Data: PPS 20%    Flowsheet Rows    Flowsheet Row Most Recent Value  Intake Tab   Referral Department --  [IMTS]  Unit at Time of Referral Cardiac/Telemetry Unit  Palliative Care Primary Diagnosis Neurology  Date Notified 08/15/21  Palliative Care Type New Palliative care  Reason for referral Clarify Goals of Care  Date of Admission 08/12/21  Date first seen by Palliative Care 08/17/21  # of days Palliative referral response time 2 Day(s)  # of days IP prior to Palliative referral 3  Clinical Assessment   Psychosocial & Spiritual Assessment   Palliative Care Outcomes        Patient Active Problem List   Diagnosis Date Noted   Hospice care patient 08/18/2021   Fracture of multiple ribs 08/13/2021   Closed compression fracture of body of L1 vertebra (HCC) 08/13/2021   Fall 08/13/2021   Acute on chronic systolic heart failure (HCC) 08/13/2021   Acute encephalopathy 08/13/2021   CHF exacerbation (HCC) 08/12/2021   History of aortic valve replacement 03/02/2021   Primary hypertension 03/02/2021   Mixed hyperlipidemia 03/02/2021  Nonrheumatic tricuspid valve regurgitation 03/02/2021   Nonrheumatic mitral valve regurgitation 03/02/2021   Pulmonary hypertension, unspecified (HCC) 03/02/2021   Scarlet fever    Hypertension    Heart murmur    Fatigue    Dizziness    Depression    COPD (chronic obstructive pulmonary disease) (HCC)    Arthritis    Arrhythmia    CHF (congestive heart failure) (HCC)    Coronary artery disease    Atrial fibrillation (HCC)    Hyperlipidemia     Palliative Care Assessment & Plan   HPI: 86 y.o. male  with past medical history of A fib, CHF, COPD, CAD, HLD, HTN, GERD, and depression admitted on 08/12/2021 with a fall that resulted in multiple rib fractures and  spinal fracture. Hospital course has been complicated by delirium - forcefully removed catheter and now has hematuria. Patient now more lethargic and concern for aspiration pneumonia. PMT consulted to discuss GOC.   Assessment: Follow up today with both daughters Tresa Endo and Velna Hatchet. We review patient condition. Review severe agitation overnight.  They are concerned today as his voice sounds as though he is not managing secretions well - we discuss this is likely.  They tells me they have taken the time to consider our conversation yesterday and spoken to other family members including his adult grandchildren - all family is interested in a shift to focus on his comfort only and allow natural disease progression.  They are interested in a hospice facility close to them in [facility]. We discuss shift to comfort measures in the hospital - use medications to promote comfort though this may result in more sedation will attempt to balance mental status with symptom control. Discuss freeing patient from interventions not needed to promote comfort. They agree. Discuss hospice philosophy of care of type of care patient will receive at hospice facility. Referral given to hospice liaison.   Recommendations/Plan: Shift to comfort measures only - requested RN administer dose of ativan to calm patient Transfer to hospice facility when bed available Avoid morphine in renal failure - will order low dose dilaudid PRN  Goals of Care and Additional Recommendations: Limitations on Scope of Treatment: Full Comfort Care  Code Status: DNR  Prognosis:  < 2 weeks  Discharge Planning: Hospice facility - [facility] plan was discussed with patient's 2 daughters, IMTS and Dr. Oswaldo Done, RN, hospice liaison  Thank you for allowing the Palliative Medicine Team to assist in the care of this patient.   Total Time 45 minutes Prolonged Time Billed  no       Greater than 50%  of this time was spent  counseling and coordinating care related to the above assessment and plan.  Gerlean Ren, DNP, [facility] Palliative Medicine Team Team Phone # [phone]  Pager [phone]

## 2021-08-18 NOTE — Hospital Course (Signed)
Altered Mental Status Patient presented to the ED from OSH after a fall with altered mental status. He had been altered for 2 weeks prior to the ED visit. In the OSH, the patient had imaging suggesting he had multiple broken ribs and L1 compression fracture. Head imaging was negative for acute findings. He exhibited agitation and aggressive behavior requiring him to be restrained. During this agitation he pulled out his IV and would tug on his foley catheter. The patient's mental status improved after IV diuresis and foley catheter to remove retained urine. After some time being more oriented, the patient became disoriented and agitated again. Low dose Seroquel was trailed. Patient fell after trying to get up and repeat imaging was negative for any acute findings. UA showed hematuria most likely to pulling of the foley catheter and urology was consulted.    Rib Fracture/Spinal Fracture 2/2 Fall Patient had multiple rib fractures and L1 compression fracture. Trauma surgery saw the patient recommended pain control, incentive spirometry, and pulmonary toilet. The long term goal was rehab if patient could tolerate it.   Acute on Chronic Systolic CHF Patient has CHF with EF of 30-35% with severe dilation of the left atrium. In home, he is on Lasix 40 mg daily. At admission his BNP was 1,193.  IV lasix followed by PO lasix was given to patient to reach euvolemia.   COPD: Patient had a history of COPD and exhibited wheezing and shortness of breath on admission. Daughter stated he sporadically uses his inhalers. Duonebs, Breo and Incruse were started with improvement in his breathing.

## 2021-08-18 NOTE — TOC Progression Note (Signed)
Transition of Care [facility]) - Progression Note    Patient Details  Name: [name] [name] MRN: [id number] Date of Birth: 06-18-1935  Transition of Care [facility]) CM/SW Contact  Terrial Rhodes, LCSWA Phone Number: 08/18/2021, 1:32 PM  Clinical Narrative:      CSW continues to follow for dc plan. CSW will continue to follow and assist with dc planning needs.  Expected Discharge Plan: Skilled Nursing Facility Barriers to Discharge: Continued Medical Work up  Expected Discharge Plan and Services Expected Discharge Plan: Skilled Nursing Facility In-house Referral: Clinical Social Work     Living arrangements for the past 2 months: Single Family Home                                       Social Determinants of Health (SDOH) Interventions    Readmission Risk Interventions No flowsheet data found.

## 2021-08-21 LAB — CULTURE, BLOOD (ROUTINE X 2)
Culture: NO GROWTH
Special Requests: ADEQUATE

## 2021-08-29 MED FILL — Fentanyl Citrate Preservative Free (PF) Inj 100 MCG/2ML: INTRAMUSCULAR | Qty: 1 | Status: AC

## 2021-09-16 DEATH — deceased

## 2023-07-30 IMAGING — CT CT HEAD W/O CM
3 of 6 series · 16 of 47 positions shown, 19 images · non-contrast
Comparison: None.

CLINICAL DATA: Fall

EXAM:
CT HEAD WITHOUT CONTRAST
TECHNIQUE: Contiguous axial images were obtained from the base of the skull
through the vertex without intravenous contrast.

[Series 4: head 5.0 h30s · axial · 0.46mm/px · z∈[+252,+407]mm · 10 of 37 slices shown, 13 images]
[im 3/37  brain]
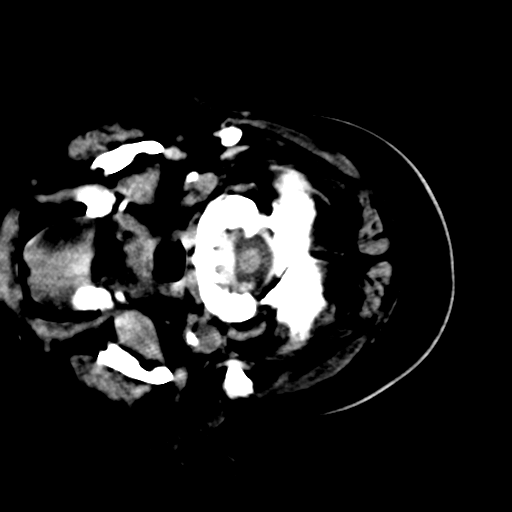
[im 3/37  bone]
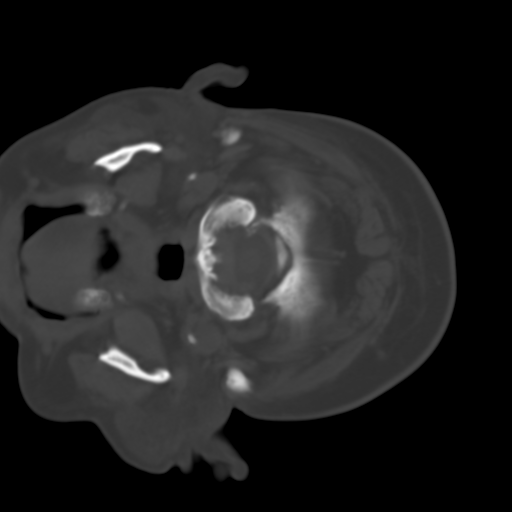
[im 6/37  brain]
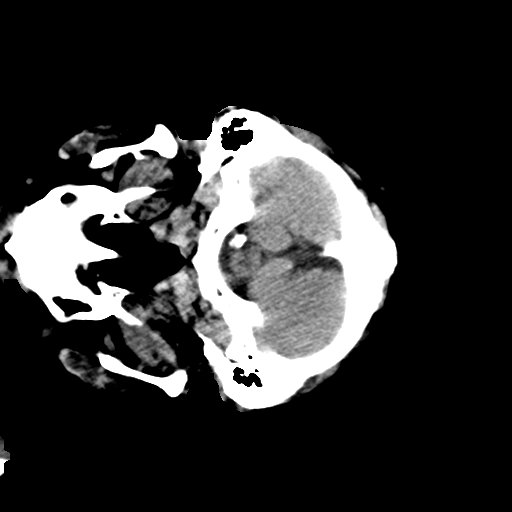
[im 11/37  brain]
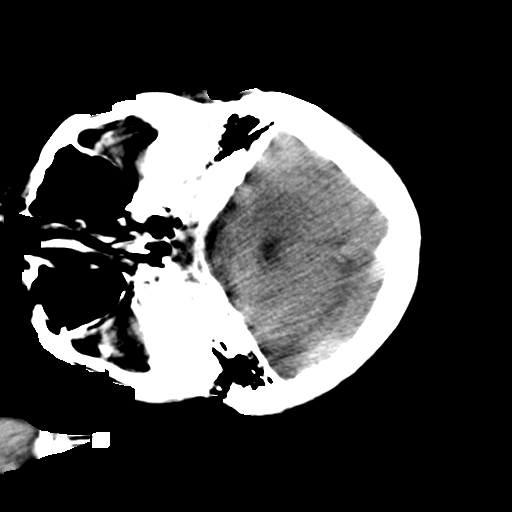
[im 13/37  brain]
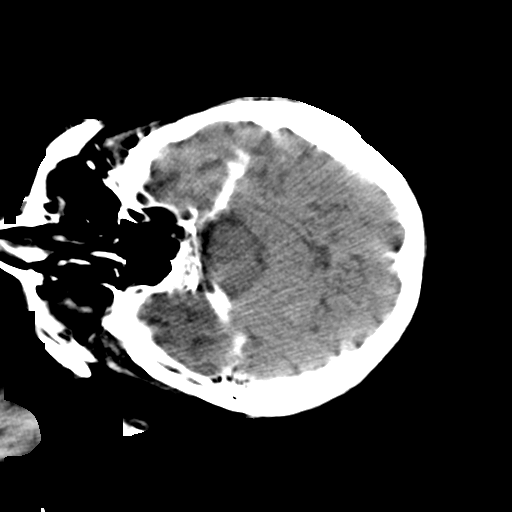
[im 16/37  brain]
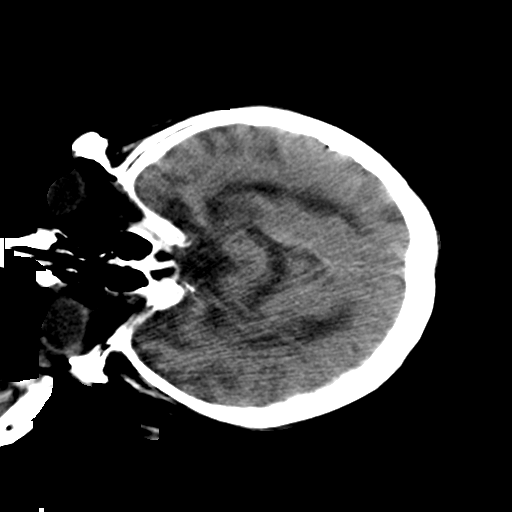
[im 16/37  bone]
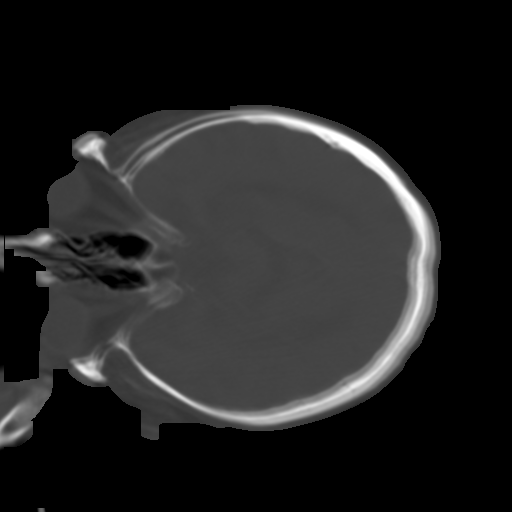
[im 21/37  brain]
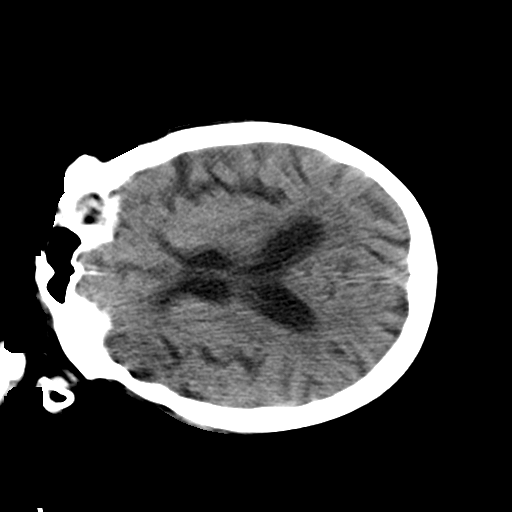
[im 24/37  brain]
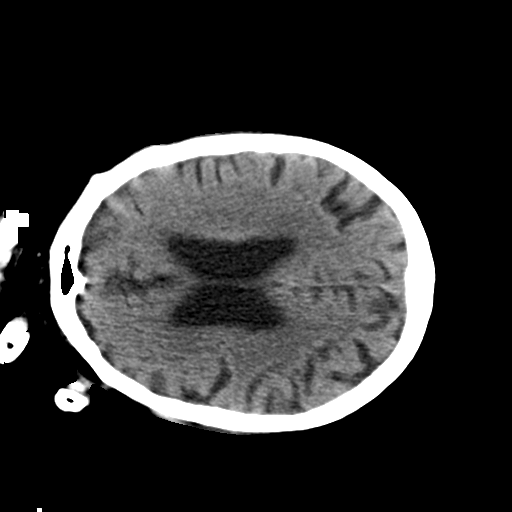
[im 26/37  brain]
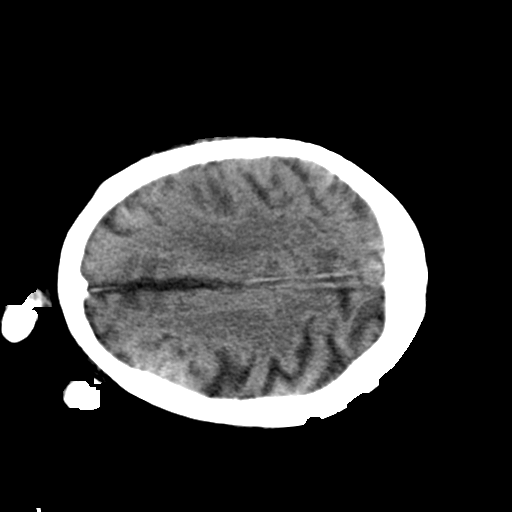
[im 31/37  brain]
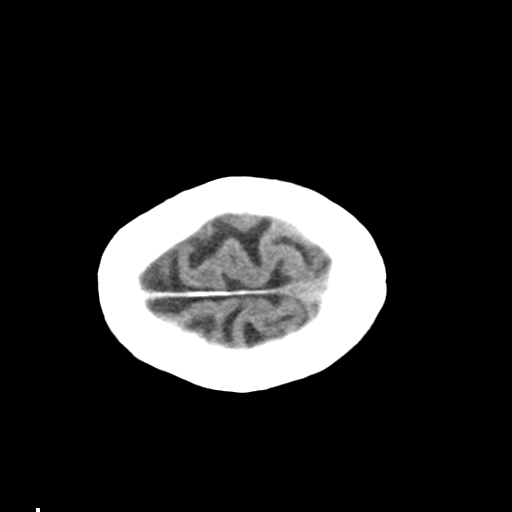
[im 31/37  bone]
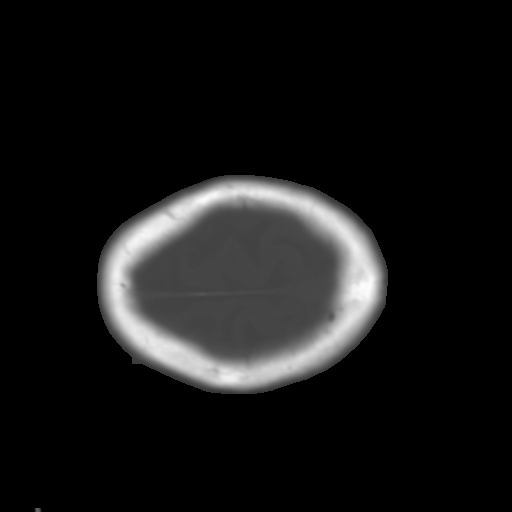
[im 34/37  brain]
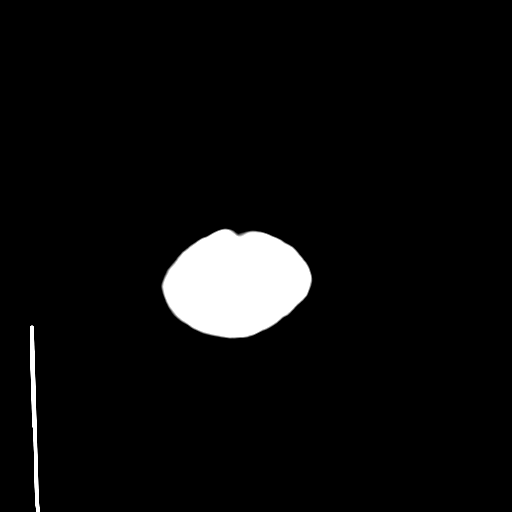

[Series 8: head 3.0 mpr cor · sagittal · 0.34mm/px · 3 of 67 slices shown]
[im 17/67  brain]
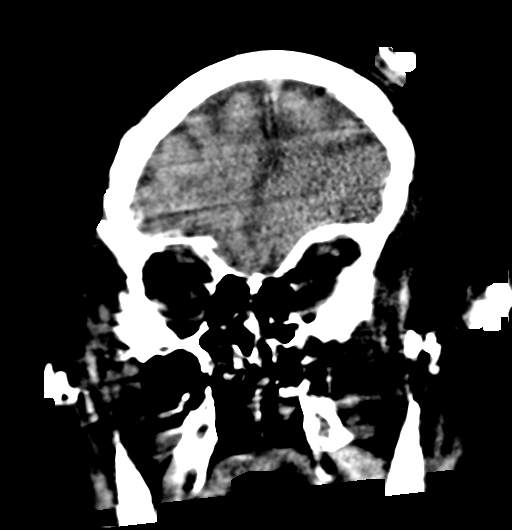
[im 34/67  brain]
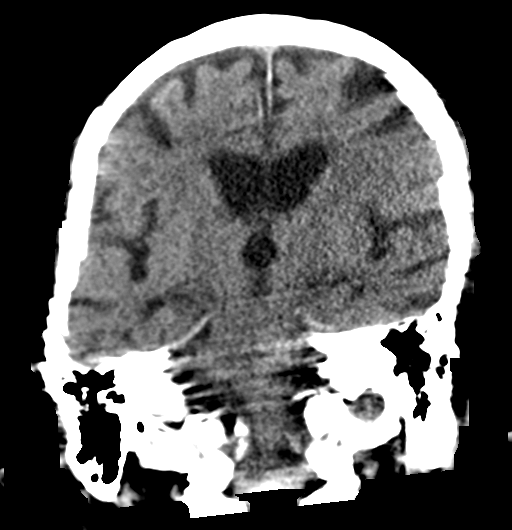
[im 50/67  brain]
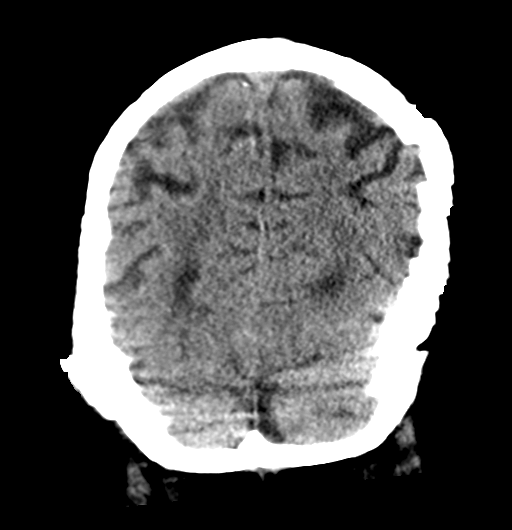

[Series 9: head 3.0 mpr sag · coronal · 0.35mm/px · 3 of 57 slices shown]
[im 15/57  brain]
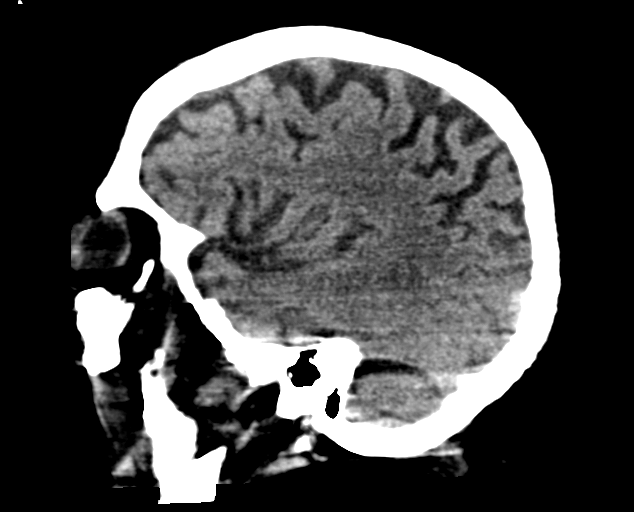
[im 29/57  brain]
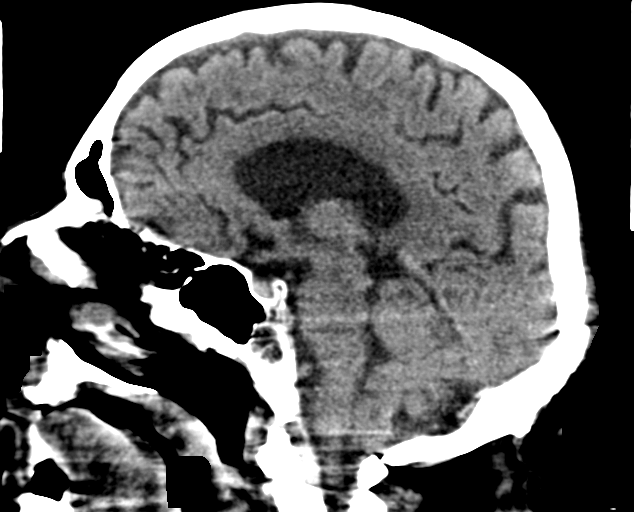
[im 43/57  brain]
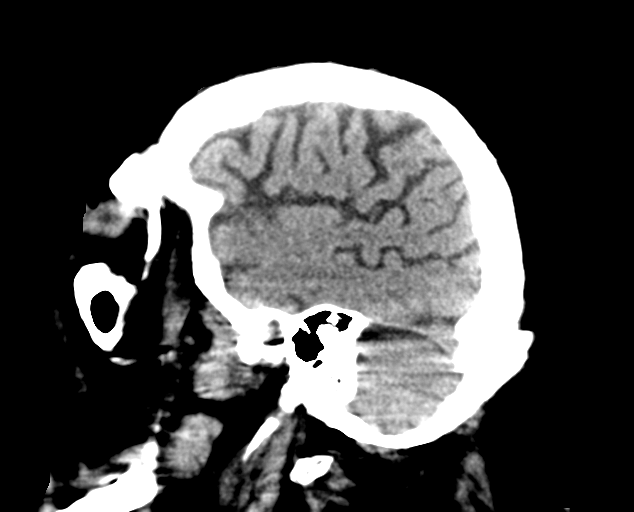

[16 of 47 positions shown; findings below may reference images not displayed]

FINDINGS: Motion degraded images.

Brain: No evidence of acute infarction, hemorrhage, hydrocephalus,
extra-axial collection or mass lesion/mass effect.

Subcortical white matter and periventricular small vessel ischemic
changes.

Vascular: Intracranial atherosclerosis.

Skull: Normal. Negative for fracture or focal lesion.

Sinuses/Orbits: The visualized paranasal sinuses are essentially
clear. The mastoid air cells are unopacified.

Other: None.
IMPRESSION: Motion degraded images.

No evidence of acute intracranial abnormality.

Small vessel ischemic changes.
